# Patient Record
Sex: Male | Born: 1970 | Race: White | Hispanic: No | Marital: Married | Smoking: Never smoker
Health system: Southern US, Community
[De-identification: ages and names within clinical notes are randomized; demographics above are authoritative.]

## PROBLEM LIST (undated history)

## (undated) DIAGNOSIS — K429 Umbilical hernia without obstruction or gangrene: Secondary | ICD-10-CM

## (undated) DIAGNOSIS — G4733 Obstructive sleep apnea (adult) (pediatric): Secondary | ICD-10-CM

## (undated) DIAGNOSIS — Z9989 Dependence on other enabling machines and devices: Secondary | ICD-10-CM

## (undated) DIAGNOSIS — G4730 Sleep apnea, unspecified: Secondary | ICD-10-CM

## (undated) HISTORY — PX: TONSILLECTOMY: SUR1361

---

## 1998-08-22 ENCOUNTER — Ambulatory Visit (HOSPITAL_COMMUNITY): Admission: RE | Admit: 1998-08-22 | Discharge: 1998-08-22 | Payer: Self-pay | Admitting: Internal Medicine

## 1998-08-22 ENCOUNTER — Encounter: Payer: Self-pay | Admitting: Internal Medicine

## 2001-08-29 ENCOUNTER — Ambulatory Visit (HOSPITAL_BASED_OUTPATIENT_CLINIC_OR_DEPARTMENT_OTHER): Admission: RE | Admit: 2001-08-29 | Discharge: 2001-08-29 | Payer: Self-pay | Admitting: Internal Medicine

## 2001-11-21 ENCOUNTER — Ambulatory Visit (HOSPITAL_BASED_OUTPATIENT_CLINIC_OR_DEPARTMENT_OTHER): Admission: RE | Admit: 2001-11-21 | Discharge: 2001-11-21 | Payer: Self-pay | Admitting: Otolaryngology

## 2014-05-13 ENCOUNTER — Emergency Department (HOSPITAL_BASED_OUTPATIENT_CLINIC_OR_DEPARTMENT_OTHER): Payer: No Typology Code available for payment source

## 2014-05-13 ENCOUNTER — Encounter (HOSPITAL_BASED_OUTPATIENT_CLINIC_OR_DEPARTMENT_OTHER): Payer: Self-pay | Admitting: *Deleted

## 2014-05-13 ENCOUNTER — Emergency Department (HOSPITAL_BASED_OUTPATIENT_CLINIC_OR_DEPARTMENT_OTHER)
Admission: EM | Admit: 2014-05-13 | Discharge: 2014-05-13 | Disposition: A | Discharge status: Home / Self Care | Payer: No Typology Code available for payment source | Attending: Emergency Medicine | Admitting: Emergency Medicine

## 2014-05-13 DIAGNOSIS — M533 Sacrococcygeal disorders, not elsewhere classified: Secondary | ICD-10-CM | POA: Diagnosis not present

## 2014-05-13 DIAGNOSIS — G4730 Sleep apnea, unspecified: Secondary | ICD-10-CM | POA: Diagnosis not present

## 2014-05-13 DIAGNOSIS — R109 Unspecified abdominal pain: Secondary | ICD-10-CM | POA: Diagnosis present

## 2014-05-13 DIAGNOSIS — R112 Nausea with vomiting, unspecified: Secondary | ICD-10-CM | POA: Diagnosis not present

## 2014-05-13 DIAGNOSIS — M549 Dorsalgia, unspecified: Secondary | ICD-10-CM

## 2014-05-13 DIAGNOSIS — Z9981 Dependence on supplemental oxygen: Secondary | ICD-10-CM | POA: Insufficient documentation

## 2014-05-13 HISTORY — DX: Sleep apnea, unspecified: G47.30

## 2014-05-13 HISTORY — DX: Dependence on other enabling machines and devices: Z99.89

## 2014-05-13 LAB — URINALYSIS, ROUTINE W REFLEX MICROSCOPIC
Bilirubin Urine: NEGATIVE
Glucose, UA: NEGATIVE mg/dL
Ketones, ur: NEGATIVE mg/dL
Leukocytes, UA: NEGATIVE
Nitrite: NEGATIVE
Protein, ur: NEGATIVE mg/dL
Specific Gravity, Urine: 1.018 (ref 1.005–1.030)
Urobilinogen, UA: 1 mg/dL (ref 0.0–1.0)
pH: 5 (ref 5.0–8.0)

## 2014-05-13 LAB — URINE MICROSCOPIC-ADD ON

## 2014-05-13 MED ORDER — METHOCARBAMOL 500 MG PO TABS: 500.0000 mg | ORAL_TABLET | Freq: Two times a day (BID) | ORAL | Status: DC

## 2014-05-13 MED ORDER — ONDANSETRON HCL 4 MG/2ML IJ SOLN
4.0000 mg | Freq: Once | INTRAMUSCULAR | Status: AC
  Administered 2014-05-13: 4 mg via INTRAVENOUS
  Filled 2014-05-13: qty 2

## 2014-05-13 MED ORDER — KETOROLAC TROMETHAMINE 30 MG/ML IJ SOLN
30.0000 mg | Freq: Once | INTRAMUSCULAR | Status: AC
  Administered 2014-05-13: 30 mg via INTRAVENOUS
  Filled 2014-05-13: qty 1

## 2014-05-13 MED ORDER — GI COCKTAIL ~~LOC~~
30.0000 mL | Freq: Once | ORAL | Status: AC
  Administered 2014-05-13: 30 mL via ORAL
  Filled 2014-05-13: qty 30

## 2014-05-13 MED ORDER — MELOXICAM 7.5 MG PO TABS: 7.5000 mg | ORAL_TABLET | Freq: Every day | ORAL | Status: DC

## 2014-05-13 NOTE — ED Notes (Signed)
Patient transported to CT 

## 2014-05-13 NOTE — ED Notes (Signed)
PT returned back from CT.

## 2014-05-13 NOTE — ED Notes (Signed)
Pt states bilateral flank pain that started one month ago. States pain comes and goes. Has had n/v and fevers. Has seen primary MD but no testing to confirm a kidney stone. Denies any blood in his urine. Denies any urinary symptoms. States around midnight pain started bilateral with n/v. States he feels like he has a fever. Pt presents unable to sit still.

## 2014-05-13 NOTE — Discharge Instructions (Signed)

## 2014-05-13 NOTE — ED Provider Notes (Signed)
CSN: [id number]     Arrival date & time 05/13/14  0224 History   First MD Initiated Contact with Patient 05/13/14 0315     Chief Complaint  Patient presents with  . Flank Pain     (Consider location/radiation/quality/duration/timing/severity/associated sxs/prior Treatment) Patient is a 43 y.o. male presenting with back pain. The history is provided by the patient.  Back Pain Location:  Sacro-iliac joint Quality:  Cramping Radiates to:  Does not radiate Pain severity:  Severe Pain is:  Same all the time Onset quality:  Sudden Duration:  4 weeks Timing:  Intermittent Progression:  Unchanged Chronicity:  New Context: not MCA and not MVA   Relieved by:  Nothing Worsened by:  Nothing tried Ineffective treatments:  None tried Associated symptoms: no abdominal pain, no abdominal swelling, no bladder incontinence, no bowel incontinence, no chest pain, no dysuria, no headaches, no leg pain, no numbness, no paresthesias, no pelvic pain, no perianal numbness, no tingling and no weakness   Risk factors: no hx of cancer   Nausea and vomiting  Past Medical History  Diagnosis Date  . Sleep apnea   . CPAP (continuous positive airway pressure) dependence    Past Surgical History  Procedure Laterality Date  . Tonsillectomy     No family history on file. History  Substance Use Topics  . Smoking status: Never Smoker   . Smokeless tobacco: Not on file  . Alcohol Use: Yes     Comment: occasional     Review of Systems  Cardiovascular: Negative for chest pain.  Gastrointestinal: Negative for abdominal pain and bowel incontinence.  Genitourinary: Negative for bladder incontinence, dysuria and pelvic pain.  Musculoskeletal: Positive for back pain.  Neurological: Negative for tingling, weakness, numbness, headaches and paresthesias.  All other systems reviewed and are negative.     Allergies  Zithromax  Home Medications   Prior to Admission medications   Not on File   BP  144/85 mmHg  Pulse 56  Temp(Src) 97.6 F (36.4 C)  Resp 18  Ht 5\' 11"  (1.803 m)  Wt 230 lb (104.327 kg)  BMI 32.09 kg/m2  SpO2 100% Physical Exam  Constitutional: He is oriented to person, place, and time. He appears well-developed and well-nourished. No distress.  HENT:  Head: Normocephalic and atraumatic.  Mouth/Throat: Oropharynx is clear and moist.  Eyes: Conjunctivae are normal. Pupils are equal, round, and reactive to light.  Neck: Normal range of motion. Neck supple.  Cardiovascular: Normal rate and regular rhythm.   Pulmonary/Chest: Effort normal and breath sounds normal. No stridor. He has no wheezes. He has no rales.  Abdominal: Soft. Bowel sounds are increased. There is no tenderness. There is no rigidity, no rebound, no guarding, no tenderness at McBurney's point and negative Murphy's sign.  Musculoskeletal: Normal range of motion.       Lumbar back: He exhibits normal range of motion, no tenderness, no bony tenderness, no swelling, no edema, no deformity, no laceration, no pain, no spasm and normal pulse.       Arms: Neurological: He is alert and oriented to person, place, and time. He displays normal reflexes. Coordination normal.  Skin: Skin is warm and dry.  Psychiatric: He has a normal mood and affect.    ED Course  Procedures (including critical care time) Labs Review Labs Reviewed  URINALYSIS, ROUTINE W REFLEX MICROSCOPIC - Abnormal; Notable for the following:    Hgb urine dipstick SMALL (*)    All other components within normal limits  URINE MICROSCOPIC-ADD ON    Imaging Review Ct Renal Stone Study  05/13/2014   CLINICAL DATA:  Bilateral flank pain beginning 1 month ago.  EXAM: CT ABDOMEN AND PELVIS WITHOUT CONTRAST  TECHNIQUE: Multidetector CT imaging of the abdomen and pelvis was performed following the standard protocol without IV contrast.  COMPARISON:  None.  FINDINGS: The lung bases are clear.  No pleural or pericardial effusion.  No renal or ureteral  stones are identified. There is no hydronephrosis on the right or left. The urinary bladder is unremarkable. The prostate gland is mildly enlarged.  The liver, gallbladder, spleen, adrenal glands, biliary tree and pancreas appear normal. The stomach, small and large bowel and appendix appear normal. There is no lymphadenopathy or fluid. A very small left periumbilical hernia is identified. No focal bony abnormality is identified.  IMPRESSION: Negative for urinary tract stone.  No acute finding.  Mild enlargement of prostate gland.  Small fat containing periumbilical hernia.   Electronically Signed   By: Drusilla Kannerhomas  Dalessio M.D.   On: 05/13/2014 03:53     EKG Interpretation None      MDM   Final diagnoses:  Back pain    There are no stones in the kidneys or ureters.  Bones and bowel are healthy.  Suspect muscular pain as the source will treat with NSAIDs and muscle relaxers and have advised close follow up    Mycheal Veldhuizen Smitty CordsK Markcus Lazenby-Rasch, MD 05/13/14 [phone]

## 2014-05-18 ENCOUNTER — Encounter (HOSPITAL_BASED_OUTPATIENT_CLINIC_OR_DEPARTMENT_OTHER): Payer: Self-pay | Admitting: Emergency Medicine

## 2014-05-18 ENCOUNTER — Emergency Department (HOSPITAL_BASED_OUTPATIENT_CLINIC_OR_DEPARTMENT_OTHER)
Admission: EM | Admit: 2014-05-18 | Discharge: 2014-05-18 | Disposition: A | Discharge status: Home / Self Care | Payer: No Typology Code available for payment source | Attending: Emergency Medicine | Admitting: Emergency Medicine

## 2014-05-18 DIAGNOSIS — Z79899 Other long term (current) drug therapy: Secondary | ICD-10-CM | POA: Insufficient documentation

## 2014-05-18 DIAGNOSIS — M5440 Lumbago with sciatica, unspecified side: Secondary | ICD-10-CM | POA: Insufficient documentation

## 2014-05-18 DIAGNOSIS — Z791 Long term (current) use of non-steroidal anti-inflammatories (NSAID): Secondary | ICD-10-CM | POA: Diagnosis not present

## 2014-05-18 DIAGNOSIS — M545 Low back pain: Secondary | ICD-10-CM

## 2014-05-18 DIAGNOSIS — G4730 Sleep apnea, unspecified: Secondary | ICD-10-CM | POA: Insufficient documentation

## 2014-05-18 DIAGNOSIS — Z9981 Dependence on supplemental oxygen: Secondary | ICD-10-CM | POA: Diagnosis not present

## 2014-05-18 LAB — URINALYSIS, ROUTINE W REFLEX MICROSCOPIC
Bilirubin Urine: NEGATIVE
Glucose, UA: NEGATIVE mg/dL
Ketones, ur: NEGATIVE mg/dL
Leukocytes, UA: NEGATIVE
Nitrite: NEGATIVE
PH: 6 (ref 5.0–8.0)
PROTEIN: NEGATIVE mg/dL
Specific Gravity, Urine: 1.035 — ABNORMAL HIGH (ref 1.005–1.030)
Urobilinogen, UA: 2 mg/dL — ABNORMAL HIGH (ref 0.0–1.0)

## 2014-05-18 LAB — URINE MICROSCOPIC-ADD ON

## 2014-05-18 MED ORDER — KETOROLAC TROMETHAMINE 60 MG/2ML IM SOLN
60.0000 mg | Freq: Once | INTRAMUSCULAR | Status: AC
  Administered 2014-05-18: 60 mg via INTRAMUSCULAR
  Filled 2014-05-18: qty 2

## 2014-05-18 MED ORDER — NAPROXEN 500 MG PO TABS: 500.0000 mg | ORAL_TABLET | Freq: Two times a day (BID) | ORAL | Status: AC

## 2014-05-18 MED ORDER — OXYCODONE-ACETAMINOPHEN 5-325 MG PO TABS: 1.0000 | ORAL_TABLET | ORAL | Status: DC | PRN

## 2014-05-18 MED ORDER — ORPHENADRINE CITRATE ER 100 MG PO TB12: 100.0000 mg | ORAL_TABLET | Freq: Two times a day (BID) | ORAL | Status: DC

## 2014-05-18 NOTE — ED Notes (Signed)
Pt states that the pain is too deep for heat or ice to provide comfort, pt laughing and walking around during triage, states that it hurts to sit, pt stated that he has been taking the mobic, but trying to wean off the muscle relaxer in am. Stated that pain gets significantly worse throughout  His day, but reports that he has been resting and trying to give it time to heal during the day, therefore not doing much activity

## 2014-05-18 NOTE — ED Notes (Signed)
Pt states that he has had lower back pain x 1 1/2 months, seen here on 11/9, pt got some better, tonight it got significantly worse to bilaterally lower back, pt states pcp can see him on monday

## 2014-05-18 NOTE — ED Notes (Signed)
Med hold till 0325

## 2014-05-18 NOTE — ED Provider Notes (Signed)
CSN: [id number]     Arrival date & time 05/18/14  0207 History   First MD Initiated Contact with Patient 05/18/14 [phone]     Chief Complaint  Patient presents with  . Back Pain     (Consider location/radiation/quality/duration/timing/severity/associated sxs/prior Treatment) Patient is a 43 y.o. male presenting with back pain. The history is provided by the patient.  Back Pain He has been having pain across his lower back for the last month, but it has been worse over the last week. Pain was initially intermittent and is now constant. He rates pain at 7/10. It is worse with movement and better with being still. Pain does not radiate. He describes it as a sharp pain. He denies any weakness, numbness, tingling. There is no bowel or bladder dysfunction. He had been seen in the ED and had been given a shot of ketorolac which did give him some temporary relief, and was discharged with prescriptions for meloxicam and methocarbamol which have not been giving him any relief. If anything, pain seems to be getting worse. He denies any trauma and denies any unusual activity. He does relate recent 30 pound voluntary weight loss which was initiated because he was told he was prediabetic.  Past Medical History  Diagnosis Date  . Sleep apnea   . CPAP (continuous positive airway pressure) dependence    Past Surgical History  Procedure Laterality Date  . Tonsillectomy     History reviewed. No pertinent family history. History  Substance Use Topics  . Smoking status: Never Smoker   . Smokeless tobacco: Not on file  . Alcohol Use: Yes     Comment: occasional     Review of Systems  Musculoskeletal: Positive for back pain.  All other systems reviewed and are negative.     Allergies  Zithromax  Home Medications   Prior to Admission medications   Medication Sig Start Date End Date Taking? Authorizing Provider  meloxicam (MOBIC) 7.5 MG tablet Take 1 tablet (7.5 mg total) by mouth daily. 05/13/14    April K Palumbo-Rasch, MD  methocarbamol (ROBAXIN) 500 MG tablet Take 1 tablet (500 mg total) by mouth 2 (two) times daily. 05/13/14   April K Palumbo-Rasch, MD  naproxen (NAPROSYN) 500 MG tablet Take 1 tablet (500 mg total) by mouth 2 (two) times daily. 05/18/14   Dione Boozeavid Kiarah Eckstein, MD  orphenadrine (NORFLEX) 100 MG tablet Take 1 tablet (100 mg total) by mouth 2 (two) times daily. 05/18/14   Dione Boozeavid Avalie Oconnor, MD  oxyCODONE-acetaminophen (PERCOCET) 5-325 MG per tablet Take 1 tablet by mouth every 4 (four) hours as needed for moderate pain. 05/18/14   Dione Boozeavid Tyria Springer, MD   BP 143/76 mmHg  Pulse 76  Temp(Src) 98.6 F (37 C) (Oral)  Resp 18  SpO2 100% Physical Exam  Nursing note and vitals reviewed.  43 year old male, resting comfortably and in no acute distress. Vital signs are significant for borderline hypertension. Oxygen saturation is 100%, which is normal. Head is normocephalic and atraumatic. PERRLA, EOMI. Oropharynx is clear. Neck is nontender and supple without adenopathy or JVD. Back is moderately tender throughout the soft tissues of the area lumbar area. There is mild bilateral CVA tenderness. Straight leg raise is positive bilaterally at 30. There is mild to moderate paralumbar spasm bilaterally. Lungs are clear without rales, wheezes, or rhonchi. Chest is nontender. Heart has regular rate and rhythm without murmur. Abdomen is soft, flat, nontender without masses or hepatosplenomegaly and peristalsis is normoactive. Extremities have no cyanosis or  edema, full range of motion is present. Skin is warm and dry without rash. Neurologic: Mental status is normal, cranial nerves are intact, there are no motor or sensory deficits.  ED Course  Procedures (including critical care time) Labs Review Results for orders placed or performed during the hospital encounter of 05/13/14  Urinalysis, Routine w reflex microscopic  Result Value Ref Range   Color, Urine YELLOW YELLOW   APPearance CLEAR CLEAR    Specific Gravity, Urine 1.018 1.005 - 1.030   pH 5.0 5.0 - 8.0   Glucose, UA NEGATIVE NEGATIVE mg/dL   Hgb urine dipstick SMALL (A) NEGATIVE   Bilirubin Urine NEGATIVE NEGATIVE   Ketones, ur NEGATIVE NEGATIVE mg/dL   Protein, ur NEGATIVE NEGATIVE mg/dL   Urobilinogen, UA 1.0 0.0 - 1.0 mg/dL   Nitrite NEGATIVE NEGATIVE   Leukocytes, UA NEGATIVE NEGATIVE  Urine microscopic-add on  Result Value Ref Range   Squamous Epithelial / LPF RARE RARE   WBC, UA 0-2 <3 WBC/hpf   RBC / HPF 0-2 <3 RBC/hpf   Bacteria, UA RARE RARE    MDM   Final diagnoses:  Bilateral low back pain, with sciatica presence unspecified    Low back pain which has failed to respond to a course of NSAIDs and muscle relaxers. Old records are reviewed and he had workup with CT scan on his previous ED visit. The scan showed no evidence of renal calculi. I reviewed the scan and the lumbar spine appears normal with mild degenerative changes. Pain does seem to be musculoskeletal. I will try switching his NSAID and muscle laxer. He is discharged with prescriptions for naproxen and orphenadrine and is also given a prescription for oxycodone-acetaminophen. He is to follow-up with his PCP. Recommended consideration for trial of physical therapy.    Dione Boozeavid Vivan Vanderveer, MD 05/18/14 [phone]

## 2014-05-18 NOTE — Discharge Instructions (Signed)
Back Pain, Adult °Low back pain is very common. About 1 in 5 people have back pain. The cause of low back pain is rarely dangerous. The pain often gets better over time. About half of people with a sudden onset of back pain feel better in just 2 weeks. About 8 in 10 people feel better by 6 weeks.  °CAUSES °Some common causes of back pain include: °· Strain of the muscles or ligaments supporting the spine. °· Wear and tear (degeneration) of the spinal discs. °· Arthritis. °· Direct injury to the back. °DIAGNOSIS °Most of the time, the direct cause of low back pain is not known. However, back pain can be treated effectively even when the exact cause of the pain is unknown. Answering your caregiver's questions about your overall health and symptoms is one of the most accurate ways to make sure the cause of your pain is not dangerous. If your caregiver needs more information, he or she may order lab work or imaging tests (X-rays or MRIs). However, even if imaging tests show changes in your back, this usually does not require surgery. °HOME CARE INSTRUCTIONS °For many people, back pain returns. Since low back pain is rarely dangerous, it is often a condition that people can learn to manage on their own.  °· Remain active. It is stressful on the back to sit or stand in one place. Do not sit, drive, or stand in one place for more than 30 minutes at a time. Take short walks on level surfaces as soon as pain allows. Try to increase the length of time you walk each day. °· Do not stay in bed. Resting more than 1 or 2 days can delay your recovery. °· Do not avoid exercise or work. Your body is made to move. It is not dangerous to be active, even though your back may hurt. Your back will likely heal faster if you return to being active before your pain is gone. °· Pay attention to your body when you  bend and lift. Many people have less discomfort when lifting if they bend their knees, keep the load close to their bodies, and  avoid twisting. Often, the most comfortable positions are those that put less stress on your recovering back. °· Find a comfortable position to sleep. Use a firm mattress and lie on your side with your knees slightly bent. If you lie on your back, put a pillow under your knees. °· Only take over-the-counter or prescription medicines as directed by your caregiver. Over-the-counter medicines to reduce pain and inflammation are often the most helpful. Your caregiver may prescribe muscle relaxant drugs. These medicines help dull your pain so you can more quickly return to your normal activities and healthy exercise. °· Put ice on the injured area. °¨ Put ice in a plastic bag. °¨ Place a towel between your skin and the bag. °¨ Leave the ice on for 15-20 minutes, 03-04 times a day for the first 2 to 3 days. After that, ice and heat may be alternated to reduce pain and spasms. °· Ask your caregiver about trying back exercises and gentle massage. This may be of some benefit. °· Avoid feeling anxious or stressed. Stress increases muscle tension and can worsen back pain. It is important to recognize when you are anxious or stressed and learn ways to manage it. Exercise is a great option. °SEEK MEDICAL CARE IF: °· You have pain that is not relieved with rest or medicine. °· You have pain that does not improve in 1 week. °· You have new symptoms. °· You are generally not feeling well. °SEEK   IMMEDIATE MEDICAL CARE IF:  °· You have pain that radiates from your back into your legs. °· You develop new bowel or bladder control problems. °· You have unusual weakness or numbness in your arms or legs. °· You develop nausea or vomiting. °· You develop abdominal pain. °· You feel faint. °Document Released: 06/21/2005 Document Revised: 12/21/2011 Document Reviewed: 10/23/2013 °[employer]® Patient Information ©2015 [employer]. This information is not intended to replace advice given to you by your health care provider. Make sure you  discuss any questions you have with your health care provider. ° °Esomeprazole; naproxen delayed release tablets °What is this medicine? °ESOMEPRAZOLE; NAPROXEN (es oh ME pray zol; na PROX en) is two medicines together. Naproxen is a non-steroidal anti-inflammatory drug (NSAID). It is used to treat the pain of arthritis. Esomeprazole is a proton pump inhibitor (PPI). It is used to prevent stomach problems from the naproxen. °This medicine may be used for other purposes; ask your health care provider or pharmacist if you have questions. °COMMON BRAND NAME(S): Vimovo °What should I tell my health care provider before I take this medicine? °They need to know if you have any of these conditions: °-asthma °-cigarette smoker °-drink more than 3 alcohol containing beverages a day °-heart disease or circulation problems such as heart failure or leg edema (fluid retention) °-high blood pressure °-kidney disease °-liver disease °-low levels of magnesium in the blood °-stomach bleeding or ulcers °-an unusual or allergic reaction to naproxen, aspirin, other NSAIDs, esomeprazole or other proton pump inhibitors, other medicines, foods, dyes, or preservatives °-pregnant or trying to get pregnant °-breast-feeding °How should I use this medicine? °Take this medicine by mouth with a glass of water. Follow the directions on the prescription label. Do not crush, chew, split, or dissolve. Take this medicine on an empty stomach at least 30 minutes before a meal. Take your medicine at regular intervals. Do not take it more often than directed. Long term, continuous use may increase the risk of heart attack or stroke. °A special MedGuide will be given to you by the pharmacist with each prescription and refill. Be sure to read this information carefully each time. °Talk to your pediatrician regarding the use of this medicine in children. Special care may be needed. °Overdosage: If you think you've taken too much of this medicine contact a  poison control center or emergency room at once. °Overdosage: If you think you have taken too much of this medicine contact a poison control center or emergency room at once. °NOTE: This medicine is only for you. Do not share this medicine with others. °What if I miss a dose? °If you miss a dose, take it as soon as you can. If it is almost time for your next dose, take only that dose. Do not take double or extra doses. °What may interact with this medicine? °Do not take this medicine with any of the following medications: °-atazanavir °-nelfinavir °This medicine may also interact with the following medications: °-alcohol °-aspirin and aspirin-like medicines °-cholestyramine °-cidofovir °-delavirdine °-diazepam °-digoxin °-diuretics °-erlotinib °-fosphenytoin or phenytoin for seizures °-iron salts °-lithium °-medicines for blood pressure °-medicines for depression, anxiety, or psychotic disturbances °-medicines for fungal infections like ketoconazole and itraconazole °-medicines for stomach, or intestine problems, like acid reflux or GERD °-medicines that treat or prevent blood clots like warfarin, enoxaparin, and dalteparin °-methotrexate °-other NSAIDs, medicines for pain and inflammation, like ibuprofen °-pemetrexed °-probenecid °-rifampin °-steroid medicines like prednisone or cortisone °-sucralfate °-sulfonamides °-sulfonylureas °-St. John's Wort °-tacrolimus °  This list may not describe all possible interactions. Give your health care provider a list of all the medicines, herbs, non-prescription drugs, or dietary supplements you use. Also tell them if you smoke, drink alcohol, or use illegal drugs. Some items may interact with your medicine. °What should I watch for while using this medicine? °Tell your doctor or health care professional if your pain does not get better. Talk to your doctor before taking another medicine for pain. Do not treat yourself. °You may need blood work done while you are taking this  medicine. °This medicine does not prevent heart attack or stroke. In fact, this medicine may increase the chance of a heart attack or stroke. The chance may increase with longer use of this medicine and in people who have heart disease. If you take aspirin to prevent heart attack or stroke, talk with your doctor or health care professional. °Do not take other medicines that contain aspirin, ibuprofen, or naproxen with this medicine. Side effects such as stomach upset, nausea, or ulcers may be more likely to occur. Many medicines available without a prescription should not be taken with this medicine. °This medicine can cause ulcers and bleeding in the stomach and intestines at any time during treatment. Do not smoke cigarettes or drink alcohol. These increase irritation to your stomach and can make it more susceptible to damage from this medicine. Ulcers and bleeding can happen without warning symptoms and can cause death. °You may get drowsy or dizzy. Do not drive, use machinery, or do anything that needs mental alertness until you know how this medicine affects you. Do not stand or sit up quickly, especially if you are an older patient. This reduces the risk of dizzy or fainting spells. °This medicine can cause you to bleed more easily. Try to avoid damage to your teeth and gums when you brush or floss your teeth. °What side effects may I notice from receiving this medicine? °Side effects that you should report to your doctor or health care professional as soon as possible: °-allergic reactions like skin rash, itching or hives, swelling of the face, lips, or tongue °-black or bloody stools, blood in the urine or vomit °-blurred vision °-bone, muscle or joint pain °-chest pain °-dark yellow or brown urine °-difficulty breathing or wheezing °-dizziness °-fast, irregular heartbeat °-feeling faint or lightheaded °-fever or sore throat °-muscle spasms °-nausea or vomiting °-palpitations °-seizures °-slurred speech or  weakness on one side of the body °-stomach pain °-tremors °-unexplained weight gain or swelling °-unusual bleeding or bruising °-unusually weak or tired °-yellowing of eyes or skin °-vomiting °Side effects that usually do not require medical attention (report to your doctor or health care professional if they continue or are bothersome): °-constipation °-diarrhea °-dry mouth °-headache °-heartburn °-nausea °This list may not describe all possible side effects. Call your doctor for medical advice about side effects. You may report side effects to FDA at 1-800-FDA-1088. °Where should I keep my medicine? °Keep out of the reach of children. °Store at room temperature between 15 and 30 degrees C (59 and 86 degrees F). Protect from light and moisture. Throw away any unused medicine after the expiration date. °NOTE: This sheet is a summary. It may not cover all possible information. If you have questions about this medicine, talk to your doctor, pharmacist, or health care provider. °© 2015, [employer]. (2013-04-11 09:43:16) ° °Orphenadrine tablets °What is this medicine? °ORPHENADRINE (or FEN a dreen) helps to relieve pain and stiffness in muscles and   can treat muscle spasms. °This medicine may be used for other purposes; ask your health care provider or pharmacist if you have questions. °COMMON BRAND NAME(S): Norflex °What should I tell my health care provider before I take this medicine? °They need to know if you have any of these conditions: °-glaucoma °-heart disease °-kidney disease °-myasthenia gravis °-peptic ulcer disease °-prostate disease °-stomach problems °-an unusual or allergic reaction to orphenadrine, other medicines, foods, lactose, dyes, or preservatives °-pregnant or trying to get pregnant °-breast-feeding °How should I use this medicine? °Take this medicine by mouth with a full glass of water. Follow the directions on the prescription label. Take your medicine at regular intervals. Do not  take your medicine more often than directed. Do not take more than you are told to take. °Talk to your pediatrician regarding the use of this medicine in children. Special care may be needed. °Patients over 65 years old may have a stronger reaction and need a smaller dose. °Overdosage: If you think you have taken too much of this medicine contact a poison control center or emergency room at once. °NOTE: This medicine is only for you. Do not share this medicine with others. °What if I miss a dose? °If you miss a dose, take it as soon as you can. If it is almost time for your next dose, take only that dose. Do not take double or extra doses. °What may interact with this medicine? °-alcohol °-antihistamines °-barbiturates, like phenobarbital °-benzodiazepines °-cyclobenzaprine °-medicines for pain °-phenothiazines like chlorpromazine, mesoridazine, prochlorperazine, thioridazine °This list may not describe all possible interactions. Give your health care provider a list of all the medicines, herbs, non-prescription drugs, or dietary supplements you use. Also tell them if you smoke, drink alcohol, or use illegal drugs. Some items may interact with your medicine. °What should I watch for while using this medicine? °Your mouth may get dry. Chewing sugarless gum or sucking hard candy, and drinking plenty of water may help. Contact your doctor if the problem does not go away or is severe. °This medicine may cause dry eyes and blurred vision. If you wear contact lenses you may feel some discomfort. Lubricating drops may help. See your eye doctor if the problem does not go away or is severe. °You may get drowsy or dizzy. Do not drive, use machinery, or do anything that needs mental alertness until you know how this medicine affects you. Do not stand or sit up quickly, especially if you are an older patient. This reduces the risk of dizzy or fainting spells. Alcohol may interfere with the effect of this medicine. Avoid  alcoholic drinks. °What side effects may I notice from receiving this medicine? °Side effects that you should report to your doctor or health care professional as soon as possible: °-allergic reactions like skin rash, itching or hives, swelling of the face, lips, or tongue °-changes in vision °-difficulty breathing °-fast heartbeat or palpitations °-hallucinations °-light headedness, fainting spells °-vomiting °Side effects that usually do not require medical attention (report to your doctor or health care professional if they continue or are bothersome): °-dizziness °-drowsiness °-headache °-nausea °This list may not describe all possible side effects. Call your doctor for medical advice about side effects. You may report side effects to FDA at 1-800-FDA-1088. °Where should I keep my medicine? °Keep out of the reach of children. °Store at room temperature between 15 and 30 degrees C (59 and 86 degrees F). Protect from light. Keep container tightly closed. Throw away any unused   medicine after the expiration date. °NOTE: This sheet is a summary. It may not cover all possible information. If you have questions about this medicine, talk to your doctor, pharmacist, or health care provider. °© 2015, [employer]. (2008-01-16 17:19:12) ° °Acetaminophen; Oxycodone tablets °What is this medicine? °ACETAMINOPHEN; OXYCODONE (a set a MEE noe fen; ox i KOE done) is a pain reliever. It is used to treat mild to moderate pain. °This medicine may be used for other purposes; ask your health care provider or pharmacist if you have questions. °COMMON BRAND NAME(S): Endocet, Magnacet, Narvox, Percocet, Perloxx, Primalev, Primlev, Roxicet, Xolox °What should I tell my health care provider before I take this medicine? °They need to know if you have any of these conditions: °-brain tumor °-Crohn's disease, inflammatory bowel disease, or ulcerative colitis °-drug abuse or addiction °-head injury °-heart or circulation  problems °-if you often drink alcohol °-kidney disease or problems going to the bathroom °-liver disease °-lung disease, asthma, or breathing problems °-an unusual or allergic reaction to acetaminophen, oxycodone, other opioid analgesics, other medicines, foods, dyes, or preservatives °-pregnant or trying to get pregnant °-breast-feeding °How should I use this medicine? °Take this medicine by mouth with a full glass of water. Follow the directions on the prescription label. Take your medicine at regular intervals. Do not take your medicine more often than directed. °Talk to your pediatrician regarding the use of this medicine in children. Special care may be needed. °Patients over 65 years old may have a stronger reaction and need a smaller dose. °Overdosage: If you think you have taken too much of this medicine contact a poison control center or emergency room at once. °NOTE: This medicine is only for you. Do not share this medicine with others. °What if I miss a dose? °If you miss a dose, take it as soon as you can. If it is almost time for your next dose, take only that dose. Do not take double or extra doses. °What may interact with this medicine? °-alcohol °-antihistamines °-barbiturates like amobarbital, butalbital, butabarbital, methohexital, pentobarbital, phenobarbital, thiopental, and secobarbital °-benztropine °-drugs for bladder problems like solifenacin, trospium, oxybutynin, tolterodine, hyoscyamine, and methscopolamine °-drugs for breathing problems like ipratropium and tiotropium °-drugs for certain stomach or intestine problems like propantheline, homatropine methylbromide, glycopyrrolate, atropine, belladonna, and dicyclomine °-general anesthetics like etomidate, ketamine, nitrous oxide, propofol, desflurane, enflurane, halothane, isoflurane, and sevoflurane °-medicines for depression, anxiety, or psychotic disturbances °-medicines for sleep °-muscle relaxants °-naltrexone °-narcotic medicines  (opiates) for pain °-phenothiazines like perphenazine, thioridazine, chlorpromazine, mesoridazine, fluphenazine, prochlorperazine, promazine, and trifluoperazine °-scopolamine °-tramadol °-trihexyphenidyl °This list may not describe all possible interactions. Give your health care provider a list of all the medicines, herbs, non-prescription drugs, or dietary supplements you use. Also tell them if you smoke, drink alcohol, or use illegal drugs. Some items may interact with your medicine. °What should I watch for while using this medicine? °Tell your doctor or health care professional if your pain does not go away, if it gets worse, or if you have new or a different type of pain. You may develop tolerance to the medicine. Tolerance means that you will need a higher dose of the medication for pain relief. Tolerance is normal and is expected if you take this medicine for a long time. °Do not suddenly stop taking your medicine because you may develop a severe reaction. Your body becomes used to the medicine. This does NOT mean you are addicted. Addiction is a behavior related to getting and using   a drug for a non-medical reason. If you have pain, you have a medical reason to take pain medicine. Your doctor will tell you how much medicine to take. If your doctor wants you to stop the medicine, the dose will be slowly lowered over time to avoid any side effects. °You may get drowsy or dizzy. Do not drive, use machinery, or do anything that needs mental alertness until you know how this medicine affects you. Do not stand or sit up quickly, especially if you are an older patient. This reduces the risk of dizzy or fainting spells. Alcohol may interfere with the effect of this medicine. Avoid alcoholic drinks. °There are different types of narcotic medicines (opiates) for pain. If you take more than one type at the same time, you may have more side effects. Give your health care provider a list of all medicines you use. Your  doctor will tell you how much medicine to take. Do not take more medicine than directed. Call emergency for help if you have problems breathing. °The medicine will cause constipation. Try to have a bowel movement at least every 2 to 3 days. If you do not have a bowel movement for 3 days, call your doctor or health care professional. °Do not take Tylenol (acetaminophen) or medicines that have acetaminophen with this medicine. Too much acetaminophen can be very dangerous. Many nonprescription medicines contain acetaminophen. Always read the labels carefully to avoid taking more acetaminophen. °What side effects may I notice from receiving this medicine? °Side effects that you should report to your doctor or health care professional as soon as possible: °-allergic reactions like skin rash, itching or hives, swelling of the face, lips, or tongue °-breathing difficulties, wheezing °-confusion °-light headedness or fainting spells °-severe stomach pain °-unusually weak or tired °-yellowing of the skin or the whites of the eyes °Side effects that usually do not require medical attention (report to your doctor or health care professional if they continue or are bothersome): °-dizziness °-drowsiness °-nausea °-vomiting °This list may not describe all possible side effects. Call your doctor for medical advice about side effects. You may report side effects to FDA at 1-800-FDA-1088. °Where should I keep my medicine? °Keep out of the reach of children. This medicine can be abused. Keep your medicine in a safe place to protect it from theft. Do not share this medicine with anyone. Selling or giving away this medicine is dangerous and against the law. °Store at room temperature between 20 and 25 degrees C (68 and 77 degrees F). Keep container tightly closed. Protect from light. °This medicine may cause accidental overdose and death if it is taken by other adults, children, or pets. Flush any unused medicine down the toilet to  reduce the chance of harm. Do not use the medicine after the expiration date. °NOTE: This sheet is a summary. It may not cover all possible information. If you have questions about this medicine, talk to your doctor, pharmacist, or health care provider. °© 2015, [employer]. (2013-02-12 13:17:35) ° °

## 2014-07-14 ENCOUNTER — Observation Stay (HOSPITAL_BASED_OUTPATIENT_CLINIC_OR_DEPARTMENT_OTHER)
Admission: EM | Admit: 2014-07-14 | Discharge: 2014-07-16 | Disposition: A | Discharge status: Home / Self Care | Payer: 59 | Attending: General Surgery | Admitting: General Surgery

## 2014-07-14 ENCOUNTER — Encounter (HOSPITAL_BASED_OUTPATIENT_CLINIC_OR_DEPARTMENT_OTHER): Payer: Self-pay | Admitting: *Deleted

## 2014-07-14 ENCOUNTER — Emergency Department (HOSPITAL_BASED_OUTPATIENT_CLINIC_OR_DEPARTMENT_OTHER): Payer: 59

## 2014-07-14 DIAGNOSIS — Z6830 Body mass index (BMI) 30.0-30.9, adult: Secondary | ICD-10-CM | POA: Insufficient documentation

## 2014-07-14 DIAGNOSIS — K420 Umbilical hernia with obstruction, without gangrene: Secondary | ICD-10-CM | POA: Diagnosis not present

## 2014-07-14 DIAGNOSIS — K810 Acute cholecystitis: Secondary | ICD-10-CM | POA: Diagnosis present

## 2014-07-14 DIAGNOSIS — Z881 Allergy status to other antibiotic agents status: Secondary | ICD-10-CM | POA: Diagnosis not present

## 2014-07-14 DIAGNOSIS — G4733 Obstructive sleep apnea (adult) (pediatric): Secondary | ICD-10-CM | POA: Diagnosis present

## 2014-07-14 DIAGNOSIS — K429 Umbilical hernia without obstruction or gangrene: Secondary | ICD-10-CM | POA: Diagnosis present

## 2014-07-14 DIAGNOSIS — E669 Obesity, unspecified: Secondary | ICD-10-CM | POA: Diagnosis not present

## 2014-07-14 DIAGNOSIS — R109 Unspecified abdominal pain: Secondary | ICD-10-CM

## 2014-07-14 DIAGNOSIS — K8012 Calculus of gallbladder with acute and chronic cholecystitis without obstruction: Principal | ICD-10-CM | POA: Insufficient documentation

## 2014-07-14 DIAGNOSIS — K8000 Calculus of gallbladder with acute cholecystitis without obstruction: Secondary | ICD-10-CM | POA: Diagnosis present

## 2014-07-14 DIAGNOSIS — K819 Cholecystitis, unspecified: Secondary | ICD-10-CM

## 2014-07-14 HISTORY — DX: Umbilical hernia without obstruction or gangrene: K42.9

## 2014-07-14 HISTORY — DX: Obstructive sleep apnea (adult) (pediatric): G47.33

## 2014-07-14 LAB — CBC WITH DIFFERENTIAL/PLATELET
BASOS ABS: 0 10*3/uL (ref 0.0–0.1)
Basophils Relative: 0 % (ref 0–1)
Eosinophils Absolute: 0.1 10*3/uL (ref 0.0–0.7)
Eosinophils Relative: 0 % (ref 0–5)
HEMATOCRIT: 43.4 % (ref 39.0–52.0)
HEMOGLOBIN: 14.9 g/dL (ref 13.0–17.0)
LYMPHS PCT: 14 % (ref 12–46)
Lymphs Abs: 2 10*3/uL (ref 0.7–4.0)
MCH: 28.9 pg (ref 26.0–34.0)
MCHC: 34.3 g/dL (ref 30.0–36.0)
MCV: 84.1 fL (ref 78.0–100.0)
MONOS PCT: 7 % (ref 3–12)
Monocytes Absolute: 1 10*3/uL (ref 0.1–1.0)
NEUTROS ABS: 11.5 10*3/uL — AB (ref 1.7–7.7)
Neutrophils Relative %: 79 % — ABNORMAL HIGH (ref 43–77)
Platelets: 203 10*3/uL (ref 150–400)
RBC: 5.16 MIL/uL (ref 4.22–5.81)
RDW: 13.2 % (ref 11.5–15.5)
WBC: 14.6 10*3/uL — AB (ref 4.0–10.5)

## 2014-07-14 LAB — COMPREHENSIVE METABOLIC PANEL
ALBUMIN: 4.8 g/dL (ref 3.5–5.2)
ALT: 30 U/L (ref 0–53)
AST: 21 U/L (ref 0–37)
Alkaline Phosphatase: 95 U/L (ref 39–117)
Anion gap: 8 (ref 5–15)
BILIRUBIN TOTAL: 0.8 mg/dL (ref 0.3–1.2)
BUN: 27 mg/dL — AB (ref 6–23)
CO2: 24 mmol/L (ref 19–32)
CREATININE: 0.91 mg/dL (ref 0.50–1.35)
Calcium: 9.3 mg/dL (ref 8.4–10.5)
Chloride: 103 mEq/L (ref 96–112)
GFR calc Af Amer: >90 mL/min (ref >90)
GFR calc non Af Amer: >90 mL/min (ref >90)
Glucose, Bld: 114 mg/dL — ABNORMAL HIGH (ref 70–99)
POTASSIUM: 3.9 mmol/L (ref 3.5–5.1)
Sodium: 135 mmol/L (ref 135–145)
Total Protein: 8.1 g/dL (ref 6.0–8.3)

## 2014-07-14 LAB — LIPASE, BLOOD: Lipase: 34 U/L (ref 11–59)

## 2014-07-14 MED ORDER — SODIUM CHLORIDE 0.9 % IV SOLN
Freq: Once | INTRAVENOUS | Status: AC
  Administered 2014-07-15: 01:00:00 via INTRAVENOUS

## 2014-07-14 MED ORDER — SODIUM CHLORIDE 0.9 % IV SOLN
1.5000 g | Freq: Once | INTRAVENOUS | Status: AC
  Administered 2014-07-15: 1.5 g via INTRAVENOUS
  Filled 2014-07-14: qty 1.5

## 2014-07-14 NOTE — ED Provider Notes (Signed)
CSN: [id number]     Arrival date & time 07/14/14  1924 History  This chart was scribed for Vida RollerBrian D Baneen Wieseler, MD by Evon Slackerrance Branch, ED Scribe. This patient was seen in room MH08/MH08 and the patient's care was started at 9:58 PM.     Chief Complaint  Patient presents with  . Back Pain   Patient is a 44 y.o. male presenting with back pain. The history is provided by the patient. No language interpreter was used.  Back Pain  HPI Comments: John Rich is a 44 y.o. male who presents to the Emergency Department complaining of recurrent sharp constant RUQ pain and discomfort onset today 2 PM after eating lunch. Pt states that he has associated nausea and vomiting. Pt states that the pain feels similar to previous pain which has occurred over the last few days. Pt states that he has been having slight pain in the lower back over the laset 2 months - had hematuria and CT of the abd pelvis last week without acute findigns.  Pt states that the pain is worse after eating. Pt states that he has changed his diet with no relief. Denies hematuria. Pt states that he has had recently had a CT scan. Denies abdominal surgeries.  The pain is ongoing for over 6 hours on arrival.  No hx of abdominal surgery.   Past Medical History  Diagnosis Date  . Sleep apnea   . CPAP (continuous positive airway pressure) dependence    Past Surgical History  Procedure Laterality Date  . Tonsillectomy     History reviewed. No pertinent family history. History  Substance Use Topics  . Smoking status: Never Smoker   . Smokeless tobacco: Not on file  . Alcohol Use: Yes     Comment: occasional       Review of Systems  Musculoskeletal: Positive for back pain.  All other systems reviewed and are negative.    Allergies  Zithromax  Home Medications   Prior to Admission medications   Medication Sig Start Date End Date Taking? Authorizing Provider  meloxicam (MOBIC) 7.5 MG tablet Take 1 tablet (7.5 mg total) by  mouth daily. 05/13/14   April K Palumbo-Rasch, MD  methocarbamol (ROBAXIN) 500 MG tablet Take 1 tablet (500 mg total) by mouth 2 (two) times daily. 05/13/14   April K Palumbo-Rasch, MD  naproxen (NAPROSYN) 500 MG tablet Take 1 tablet (500 mg total) by mouth 2 (two) times daily. 05/18/14   Dione Boozeavid Glick, MD  orphenadrine (NORFLEX) 100 MG tablet Take 1 tablet (100 mg total) by mouth 2 (two) times daily. 05/18/14   Dione Boozeavid Glick, MD  oxyCODONE-acetaminophen (PERCOCET) 5-325 MG per tablet Take 1 tablet by mouth every 4 (four) hours as needed for moderate pain. 05/18/14   Dione Boozeavid Glick, MD   Triage Vitals: BP 149/74 mmHg  Pulse 70  Temp(Src) 98.3 F (36.8 C) (Oral)  Resp 18  Ht 5\' 11"  (1.803 m)  Wt 220 lb (99.791 kg)  BMI 30.70 kg/m2  SpO2 98%  Physical Exam  Constitutional: He appears well-developed and well-nourished. No distress.  HENT:  Head: Normocephalic and atraumatic.  Mouth/Throat: Oropharynx is clear and moist. No oropharyngeal exudate.  Eyes: Conjunctivae and EOM are normal. Pupils are equal, round, and reactive to light. Right eye exhibits no discharge. Left eye exhibits no discharge. No scleral icterus.  Neck: Normal range of motion. Neck supple. No JVD present. No thyromegaly present.  Cardiovascular: Normal rate, regular rhythm, normal heart sounds and intact distal  pulses.  Exam reveals no gallop and no friction rub.   No murmur heard. Pulmonary/Chest: Effort normal and breath sounds normal. No respiratory distress. He has no wheezes. He has no rales.  Abdominal: Soft. Bowel sounds are normal. He exhibits no distension and no mass. There is tenderness. There is no guarding.  mild to moderate RUQ tendernedss  Musculoskeletal: Normal range of motion. He exhibits no edema or tenderness.  Lymphadenopathy:    He has no cervical adenopathy.  Neurological: He is alert. Coordination normal.  Skin: Skin is warm and dry. No rash noted. No erythema.  Psychiatric: He has a normal mood and  affect. His behavior is normal.  Nursing note and vitals reviewed.   ED Course  Procedures (including critical care time) DIAGNOSTIC STUDIES: Oxygen Saturation is 98% on RA, normal by my interpretation.    COORDINATION OF CARE: 10:07 PM-Discussed treatment plan with pt at bedside and pt agreed to plan.     Labs Review Labs Reviewed  COMPREHENSIVE METABOLIC PANEL - Abnormal; Notable for the following:    Glucose, Bld 114 (*)    BUN 27 (*)    All other components within normal limits  CBC WITH DIFFERENTIAL - Abnormal; Notable for the following:    WBC 14.6 (*)    Neutrophils Relative % 79 (*)    Neutro Abs 11.5 (*)    All other components within normal limits  LIPASE, BLOOD    Imaging Review US Abdomen Complete  07/14/2014   CLINICAL DATA:  Acute onset of upper quadrant abdominal pain and right flank pain. Back pain. Nausea, vomiting and chills. Initial encounter.  EXAM: ULTRASOUND ABDOMEN COMPLETE  COMPARISON:  CT of the abdomen and pelvis from 06/26/2014  FINDINGS: Gallbladder: Vague echogenic material within the gallbladder moves in a granular fashion, thought to reflect numerous tiny stones. A few larger stones are seen, measuring up to 1.0 cm in size. There is mild gallbladder wall thickening, measuring up to 4 mm, without definite pericholecystic fluid. No ultrasonographic Murphy's sign elicited, though the patient does have relatively significant focal tenderness.  Common bile duct: Diameter: 0.2 cm, within normal limits in caliber.  Liver: No focal lesion identified. Within normal limits in parenchymal echogenicity.  IVC: No abnormality visualized.  Pancreas: Visualized portion unremarkable.  Spleen: Size and appearance within normal limits.  Right Kidney: Length: 12.6 cm. Echogenicity within normal limits. No mass or hydronephrosis visualized.  Left Kidney: Length: 13.3 cm. Echogenicity within normal limits. A small 1.4 cm cyst is noted at the interpole region of the left kidney,  with minimal associated peripheral calcification. No evidence of hydronephrosis.  Abdominal aorta: No aneurysm visualized. The mid abdominal aorta is partially obscured by overlying bowel gas.  Other findings: None.  IMPRESSION: 1. Mild gallbladder wall thickening, with gravel and stones in the gallbladder. Significant focal tenderness at the gallbladder, despite the lack of an ultrasonographic Murphy's sign. This could reflect mild acute cholecystitis. No evidence for distal obstruction. 2. Small left renal cyst, with minimal peripheral calcification.   Electronically Signed   By: Roanna Raider M.D.   On: 07/14/2014 23:01      MDM   Final diagnoses:  Abdominal pain  Acute cholecystitis   Discussed with general surgeon, Dr. Carolynne Edouard, patient has likely acute cholecystitis given abdominal exam findings, ultrasound findings and leukocytosis. He has ongoing pain, on repeat exam after ultrasound he has ongoing tenderness in the right upper quadrant, he is not peritoneal, he is not vomiting, his vital signs  are reassuring. He has been given medications as below including antibiotics and his nothing by mouth status has been verified. He will be transferred to [facility] ER for further surgical care. The patient is in agreement with the plan.  Meds given in ED:  Medications  ampicillin-sulbactam (UNASYN) 1.5 g in sodium chloride 0.9 % 50 mL IVPB (not administered)  0.9 %  sodium chloride infusion (not administered)    New Prescriptions   No medications on file   '   I personally performed the services described in this documentation, which was scribed in my presence. The recorded information has been reviewed and is accurate.      Vida Roller, MD 07/15/14 [phone]

## 2014-07-14 NOTE — ED Notes (Signed)
Pt reports chronic back and (R) sided rib/flank pain since June.  Reports flare up tonight.

## 2014-07-15 ENCOUNTER — Observation Stay (HOSPITAL_COMMUNITY): Payer: 59 | Admitting: Registered Nurse

## 2014-07-15 ENCOUNTER — Encounter (HOSPITAL_COMMUNITY): Payer: Self-pay | Admitting: *Deleted

## 2014-07-15 ENCOUNTER — Encounter (HOSPITAL_COMMUNITY)
Admission: EM | Disposition: A | Discharge status: Home / Self Care | Payer: Self-pay | Source: Home / Self Care | Attending: Emergency Medicine

## 2014-07-15 DIAGNOSIS — K8000 Calculus of gallbladder with acute cholecystitis without obstruction: Secondary | ICD-10-CM | POA: Diagnosis present

## 2014-07-15 HISTORY — PX: LAPAROSCOPIC CHOLECYSTECTOMY SINGLE PORT: SHX5891

## 2014-07-15 LAB — SURGICAL PCR SCREEN
MRSA, PCR: NEGATIVE
Staphylococcus aureus: POSITIVE — AB

## 2014-07-15 SURGERY — LAPAROSCOPIC CHOLECYSTECTOMY SINGLE SITE
Anesthesia: General | Site: Abdomen

## 2014-07-15 MED ORDER — ONDANSETRON HCL 4 MG/2ML IJ SOLN: 4.0000 mg | Freq: Four times a day (QID) | INTRAMUSCULAR | Status: DC | PRN

## 2014-07-15 MED ORDER — CHLORHEXIDINE GLUCONATE 4 % EX LIQD
1.0000 "application " | Freq: Once | CUTANEOUS | Status: DC
  Filled 2014-07-15: qty 15

## 2014-07-15 MED ORDER — SACCHAROMYCES BOULARDII 250 MG PO CAPS
250.0000 mg | ORAL_CAPSULE | Freq: Two times a day (BID) | ORAL | Status: DC
  Administered 2014-07-16: 250 mg via ORAL
  Filled 2014-07-15 (×4): qty 1

## 2014-07-15 MED ORDER — PROPOFOL 10 MG/ML IV BOLUS
INTRAVENOUS | Status: DC | PRN
  Administered 2014-07-15: 200 mg via INTRAVENOUS

## 2014-07-15 MED ORDER — 0.9 % SODIUM CHLORIDE (POUR BTL) OPTIME
TOPICAL | Status: DC | PRN
  Administered 2014-07-15: 1000 mL

## 2014-07-15 MED ORDER — MENTHOL 3 MG MT LOZG
1.0000 | LOZENGE | OROMUCOSAL | Status: DC | PRN
  Filled 2014-07-15: qty 9

## 2014-07-15 MED ORDER — IOHEXOL 300 MG/ML  SOLN
INTRAMUSCULAR | Status: DC | PRN
  Administered 2014-07-15: 1 mL

## 2014-07-15 MED ORDER — METOPROLOL TARTRATE 1 MG/ML IV SOLN
5.0000 mg | Freq: Four times a day (QID) | INTRAVENOUS | Status: DC | PRN
  Filled 2014-07-15: qty 5

## 2014-07-15 MED ORDER — MEPERIDINE HCL 50 MG/ML IJ SOLN: 6.2500 mg | INTRAMUSCULAR | Status: DC | PRN

## 2014-07-15 MED ORDER — MORPHINE SULFATE 2 MG/ML IJ SOLN
2.0000 mg | INTRAMUSCULAR | Status: DC | PRN
  Administered 2014-07-16 (×3): 2 mg via INTRAVENOUS
  Filled 2014-07-15 (×3): qty 1

## 2014-07-15 MED ORDER — PHENOL 1.4 % MT LIQD: 2.0000 | OROMUCOSAL | Status: DC | PRN

## 2014-07-15 MED ORDER — DEXAMETHASONE SODIUM PHOSPHATE 10 MG/ML IJ SOLN
INTRAMUSCULAR | Status: DC | PRN
  Administered 2014-07-15: 10 mg via INTRAVENOUS

## 2014-07-15 MED ORDER — NAPROXEN 500 MG PO TABS
500.0000 mg | ORAL_TABLET | Freq: Two times a day (BID) | ORAL | Status: DC | PRN
  Filled 2014-07-15: qty 1

## 2014-07-15 MED ORDER — MAGIC MOUTHWASH
15.0000 mL | Freq: Four times a day (QID) | ORAL | Status: DC | PRN
  Filled 2014-07-15: qty 15

## 2014-07-15 MED ORDER — CISATRACURIUM BESYLATE 20 MG/10ML IV SOLN
INTRAVENOUS | Status: AC
  Filled 2014-07-15: qty 10

## 2014-07-15 MED ORDER — FENTANYL CITRATE 0.05 MG/ML IJ SOLN
INTRAMUSCULAR | Status: DC | PRN
  Administered 2014-07-15 (×3): 50 ug via INTRAVENOUS
  Administered 2014-07-15: 100 ug via INTRAVENOUS
  Administered 2014-07-15 (×4): 50 ug via INTRAVENOUS

## 2014-07-15 MED ORDER — ATROPINE SULFATE 0.4 MG/ML IJ SOLN
INTRAMUSCULAR | Status: AC
  Filled 2014-07-15: qty 1

## 2014-07-15 MED ORDER — BISACODYL 10 MG RE SUPP: 10.0000 mg | Freq: Two times a day (BID) | RECTAL | Status: DC | PRN

## 2014-07-15 MED ORDER — ACETAMINOPHEN 10 MG/ML IV SOLN
1000.0000 mg | Freq: Once | INTRAVENOUS | Status: AC
  Administered 2014-07-15: 1000 mg via INTRAVENOUS
  Filled 2014-07-15: qty 100

## 2014-07-15 MED ORDER — METOCLOPRAMIDE HCL 5 MG/ML IJ SOLN
INTRAMUSCULAR | Status: AC
  Filled 2014-07-15: qty 2

## 2014-07-15 MED ORDER — ACETAMINOPHEN 325 MG PO TABS: 325.0000 mg | ORAL_TABLET | Freq: Four times a day (QID) | ORAL | Status: DC | PRN

## 2014-07-15 MED ORDER — FENTANYL CITRATE 0.05 MG/ML IJ SOLN
INTRAMUSCULAR | Status: AC
  Filled 2014-07-15: qty 5

## 2014-07-15 MED ORDER — LACTATED RINGERS IR SOLN
Status: DC | PRN
  Administered 2014-07-15: 1000 mL

## 2014-07-15 MED ORDER — ONDANSETRON HCL 4 MG/2ML IJ SOLN
INTRAMUSCULAR | Status: AC
  Filled 2014-07-15: qty 2

## 2014-07-15 MED ORDER — OXYCODONE HCL 5 MG PO TABS
5.0000 mg | ORAL_TABLET | ORAL | Status: DC | PRN
  Administered 2014-07-16: 10 mg via ORAL
  Filled 2014-07-15: qty 2

## 2014-07-15 MED ORDER — KCL IN DEXTROSE-NACL 20-5-0.9 MEQ/L-%-% IV SOLN
INTRAVENOUS | Status: DC
Start: 2014-07-15 — End: 2014-07-15
  Administered 2014-07-15: 07:00:00 via INTRAVENOUS
  Filled 2014-07-15 (×3): qty 1000

## 2014-07-15 MED ORDER — LACTATED RINGERS IV BOLUS (SEPSIS)
1000.0000 mL | Freq: Three times a day (TID) | INTRAVENOUS | Status: DC | PRN
  Administered 2014-07-15 (×2): 1000 mL via INTRAVENOUS
  Filled 2014-07-15 (×2): qty 1000

## 2014-07-15 MED ORDER — METRONIDAZOLE IN NACL 5-0.79 MG/ML-% IV SOLN
INTRAVENOUS | Status: AC
  Filled 2014-07-15: qty 100

## 2014-07-15 MED ORDER — CISATRACURIUM BESYLATE (PF) 10 MG/5ML IV SOLN
INTRAVENOUS | Status: DC | PRN
  Administered 2014-07-15: 8 mg via INTRAVENOUS
  Administered 2014-07-15 (×2): 2 mg via INTRAVENOUS

## 2014-07-15 MED ORDER — LACTATED RINGERS IV SOLN
INTRAVENOUS | Status: DC | PRN
  Administered 2014-07-15 (×2): via INTRAVENOUS

## 2014-07-15 MED ORDER — LIP MEDEX EX OINT
1.0000 "application " | TOPICAL_OINTMENT | Freq: Two times a day (BID) | CUTANEOUS | Status: DC
  Administered 2014-07-15 – 2014-07-16 (×3): 1 via TOPICAL
  Filled 2014-07-15: qty 7

## 2014-07-15 MED ORDER — ACETAMINOPHEN 650 MG RE SUPP: 650.0000 mg | Freq: Four times a day (QID) | RECTAL | Status: DC | PRN

## 2014-07-15 MED ORDER — KETOROLAC TROMETHAMINE 30 MG/ML IJ SOLN
INTRAMUSCULAR | Status: DC | PRN
  Administered 2014-07-15: 30 mg via INTRAVENOUS

## 2014-07-15 MED ORDER — PROPOFOL 10 MG/ML IV BOLUS
INTRAVENOUS | Status: AC
  Filled 2014-07-15: qty 20

## 2014-07-15 MED ORDER — LACTATED RINGERS IV BOLUS (SEPSIS): 1000.0000 mL | Freq: Three times a day (TID) | INTRAVENOUS | Status: DC | PRN

## 2014-07-15 MED ORDER — SUCCINYLCHOLINE CHLORIDE 20 MG/ML IJ SOLN
INTRAMUSCULAR | Status: DC | PRN
  Administered 2014-07-15: 140 mg via INTRAVENOUS

## 2014-07-15 MED ORDER — EPHEDRINE SULFATE 50 MG/ML IJ SOLN
INTRAMUSCULAR | Status: AC
  Filled 2014-07-15: qty 1

## 2014-07-15 MED ORDER — ONDANSETRON HCL 4 MG/2ML IJ SOLN
INTRAMUSCULAR | Status: DC | PRN
Start: 2014-07-15 — End: 2014-07-15
  Administered 2014-07-15: 4 mg via INTRAVENOUS

## 2014-07-15 MED ORDER — GLYCOPYRROLATE 0.2 MG/ML IJ SOLN
INTRAMUSCULAR | Status: AC
  Filled 2014-07-15: qty 3

## 2014-07-15 MED ORDER — METOPROLOL TARTRATE 12.5 MG HALF TABLET
12.5000 mg | ORAL_TABLET | Freq: Two times a day (BID) | ORAL | Status: DC | PRN
  Filled 2014-07-15: qty 1

## 2014-07-15 MED ORDER — PROMETHAZINE HCL 25 MG/ML IJ SOLN: 6.2500 mg | INTRAMUSCULAR | Status: DC | PRN

## 2014-07-15 MED ORDER — DIPHENHYDRAMINE HCL 50 MG/ML IJ SOLN: 12.5000 mg | Freq: Four times a day (QID) | INTRAMUSCULAR | Status: DC | PRN

## 2014-07-15 MED ORDER — KCL IN DEXTROSE-NACL 20-5-0.9 MEQ/L-%-% IV SOLN
INTRAVENOUS | Status: DC
  Administered 2014-07-16: 50 mL/h via INTRAVENOUS
  Filled 2014-07-15: qty 1000

## 2014-07-15 MED ORDER — HYDROMORPHONE HCL 1 MG/ML IJ SOLN
INTRAMUSCULAR | Status: AC
  Filled 2014-07-15: qty 1

## 2014-07-15 MED ORDER — CEFTRIAXONE SODIUM IN DEXTROSE 40 MG/ML IV SOLN
2.0000 g | INTRAVENOUS | Status: DC
  Administered 2014-07-15 – 2014-07-16 (×2): 2 g via INTRAVENOUS
  Filled 2014-07-15 (×2): qty 50

## 2014-07-15 MED ORDER — NEOSTIGMINE METHYLSULFATE 10 MG/10ML IV SOLN
INTRAVENOUS | Status: AC
  Filled 2014-07-15: qty 1

## 2014-07-15 MED ORDER — SODIUM CHLORIDE 0.9 % IJ SOLN
INTRAMUSCULAR | Status: AC
  Filled 2014-07-15: qty 10

## 2014-07-15 MED ORDER — PANTOPRAZOLE SODIUM 40 MG IV SOLR
40.0000 mg | Freq: Every day | INTRAVENOUS | Status: DC
  Filled 2014-07-15: qty 40

## 2014-07-15 MED ORDER — BUPIVACAINE-EPINEPHRINE (PF) 0.25% -1:200000 IJ SOLN
INTRAMUSCULAR | Status: AC
  Filled 2014-07-15: qty 30

## 2014-07-15 MED ORDER — METHOCARBAMOL 500 MG PO TABS: 1000.0000 mg | ORAL_TABLET | Freq: Four times a day (QID) | ORAL | Status: DC | PRN

## 2014-07-15 MED ORDER — METRONIDAZOLE IN NACL 5-0.79 MG/ML-% IV SOLN
INTRAVENOUS | Status: DC | PRN
  Administered 2014-07-15: 500 mg via INTRAVENOUS

## 2014-07-15 MED ORDER — BUPIVACAINE-EPINEPHRINE 0.25% -1:200000 IJ SOLN
INTRAMUSCULAR | Status: DC | PRN
  Administered 2014-07-15: 50 mL

## 2014-07-15 MED ORDER — ZOLPIDEM TARTRATE 5 MG PO TABS: 5.0000 mg | ORAL_TABLET | Freq: Every evening | ORAL | Status: DC | PRN

## 2014-07-15 MED ORDER — LIDOCAINE HCL (CARDIAC) 20 MG/ML IV SOLN
INTRAVENOUS | Status: DC | PRN
  Administered 2014-07-15: 75 mg via INTRAVENOUS

## 2014-07-15 MED ORDER — HYDROMORPHONE HCL 1 MG/ML IJ SOLN
0.2500 mg | INTRAMUSCULAR | Status: DC | PRN
Start: 2014-07-15 — End: 2014-07-16
  Administered 2014-07-15 (×2): 0.5 mg via INTRAVENOUS

## 2014-07-15 MED ORDER — METOCLOPRAMIDE HCL 5 MG/ML IJ SOLN
INTRAMUSCULAR | Status: DC | PRN
  Administered 2014-07-15: 5 mg via INTRAVENOUS

## 2014-07-15 MED ORDER — GLYCOPYRROLATE 0.2 MG/ML IJ SOLN
INTRAMUSCULAR | Status: DC | PRN
  Administered 2014-07-15: .6 mg via INTRAVENOUS

## 2014-07-15 MED ORDER — POLYETHYLENE GLYCOL 3350 17 G PO PACK
17.0000 g | PACK | Freq: Two times a day (BID) | ORAL | Status: DC | PRN
  Filled 2014-07-15: qty 1

## 2014-07-15 MED ORDER — ALUM & MAG HYDROXIDE-SIMETH 200-200-20 MG/5ML PO SUSP: 30.0000 mL | Freq: Four times a day (QID) | ORAL | Status: DC | PRN

## 2014-07-15 MED ORDER — BUPIVACAINE-EPINEPHRINE 0.25% -1:200000 IJ SOLN
INTRAMUSCULAR | Status: AC
  Filled 2014-07-15: qty 1

## 2014-07-15 MED ORDER — DEXAMETHASONE SODIUM PHOSPHATE 10 MG/ML IJ SOLN
INTRAMUSCULAR | Status: AC
  Filled 2014-07-15: qty 1

## 2014-07-15 MED ORDER — ACETAMINOPHEN 10 MG/ML IV SOLN: 1000.0000 mg | Freq: Once | INTRAVENOUS | Status: DC

## 2014-07-15 MED ORDER — MIDAZOLAM HCL 2 MG/2ML IJ SOLN
INTRAMUSCULAR | Status: AC
  Filled 2014-07-15: qty 2

## 2014-07-15 MED ORDER — METHOCARBAMOL 1000 MG/10ML IJ SOLN: 1000.0000 mg | Freq: Four times a day (QID) | INTRAVENOUS | Status: DC | PRN

## 2014-07-15 MED ORDER — NEOSTIGMINE METHYLSULFATE 10 MG/10ML IV SOLN
INTRAVENOUS | Status: DC | PRN
  Administered 2014-07-15: 2 mg via INTRAVENOUS

## 2014-07-15 MED ORDER — MIDAZOLAM HCL 5 MG/5ML IJ SOLN
INTRAMUSCULAR | Status: DC | PRN
  Administered 2014-07-15 (×2): 1 mg via INTRAVENOUS

## 2014-07-15 MED ORDER — ORPHENADRINE CITRATE ER 100 MG PO TB12
100.0000 mg | ORAL_TABLET | Freq: Two times a day (BID) | ORAL | Status: DC
  Administered 2014-07-16: 100 mg via ORAL
  Filled 2014-07-15 (×4): qty 1

## 2014-07-15 MED ORDER — LIDOCAINE HCL (CARDIAC) 20 MG/ML IV SOLN
INTRAVENOUS | Status: AC
  Filled 2014-07-15: qty 5

## 2014-07-15 SURGICAL SUPPLY — 39 items
APPLIER CLIP 5 13 M/L LIGAMAX5 (MISCELLANEOUS) ×3
APR CLP MED LRG 5 ANG JAW (MISCELLANEOUS) ×1
BAG SPEC RTRVL LRG 6X4 10 (ENDOMECHANICALS)
CABLE HIGH FREQUENCY MONO STRZ (ELECTRODE) ×3 IMPLANT
CHLORAPREP W/TINT 26ML (MISCELLANEOUS) ×3 IMPLANT
CLIP APPLIE 5 13 M/L LIGAMAX5 (MISCELLANEOUS) ×1 IMPLANT
COVER MAYO STAND STRL (DRAPES) ×3 IMPLANT
DECANTER SPIKE VIAL GLASS SM (MISCELLANEOUS) ×3 IMPLANT
DRAIN CHANNEL 19F RND (DRAIN) IMPLANT
DRAPE C-ARM 42X120 X-RAY (DRAPES) ×3 IMPLANT
DRAPE LAPAROSCOPIC ABDOMINAL (DRAPES) ×3 IMPLANT
DRAPE UTILITY XL STRL (DRAPES) ×3 IMPLANT
DRAPE WARM FLUID 44X44 (DRAPE) ×3 IMPLANT
DRSG TEGADERM 4X4.75 (GAUZE/BANDAGES/DRESSINGS) ×3 IMPLANT
ELECT REM PT RETURN 9FT ADLT (ELECTROSURGICAL) ×3
ELECTRODE REM PT RTRN 9FT ADLT (ELECTROSURGICAL) ×1 IMPLANT
ENDOLOOP SUT PDS II  0 18 (SUTURE)
ENDOLOOP SUT PDS II 0 18 (SUTURE) IMPLANT
EVACUATOR SILICONE 100CC (DRAIN) IMPLANT
GAUZE SPONGE 2X2 8PLY STRL LF (GAUZE/BANDAGES/DRESSINGS) ×1 IMPLANT
GLOVE ECLIPSE 8.0 STRL XLNG CF (GLOVE) ×3 IMPLANT
GLOVE INDICATOR 8.0 STRL GRN (GLOVE) ×3 IMPLANT
GOWN STRL REUS W/TWL XL LVL3 (GOWN DISPOSABLE) ×6 IMPLANT
KIT BASIN OR (CUSTOM PROCEDURE TRAY) ×3 IMPLANT
NS IRRIG 1000ML POUR BTL (IV SOLUTION) ×3 IMPLANT
POUCH SPECIMEN RETRIEVAL 10MM (ENDOMECHANICALS) IMPLANT
SCISSORS LAP 5X35 DISP (ENDOMECHANICALS) IMPLANT
SET CHOLANGIOGRAPH MIX (MISCELLANEOUS) ×3 IMPLANT
SET IRRIG TUBING LAPAROSCOPIC (IRRIGATION / IRRIGATOR) ×3 IMPLANT
SHEARS HARMONIC ACE PLUS 36CM (ENDOMECHANICALS) ×3 IMPLANT
SPONGE GAUZE 2X2 STER 10/PKG (GAUZE/BANDAGES/DRESSINGS) ×2
SUT MNCRL AB 4-0 PS2 18 (SUTURE) ×3 IMPLANT
SUT PDS AB 0 CT1 36 (SUTURE) ×2 IMPLANT
SYR 20CC LL (SYRINGE) ×3 IMPLANT
TOWEL OR 17X26 10 PK STRL BLUE (TOWEL DISPOSABLE) ×3 IMPLANT
TOWEL OR NON WOVEN STRL DISP B (DISPOSABLE) ×3 IMPLANT
TRAY LAPAROSCOPIC (CUSTOM PROCEDURE TRAY) ×3 IMPLANT
TROCAR BLADELESS OPT 5 100 (ENDOMECHANICALS) ×3 IMPLANT
TROCAR BLADELESS OPT 5 150 (ENDOMECHANICALS) ×3 IMPLANT

## 2014-07-15 NOTE — Transfer of Care (Signed)
Immediate Anesthesia Transfer of Care Note  Patient: John Rich  Procedure(s) Performed: Procedure(s): LAPAROSCOPIC CHOLECYSTECTOMY SINGLE SITE umblical hernia repair (N/A)  Patient Location: PACU  Anesthesia Type:General  Level of Consciousness: awake, alert , oriented and patient cooperative  Airway & Oxygen Therapy: Patient Spontanous Breathing and Patient connected to face mask oxygen  Post-op Assessment: Report given to PACU RN, Post -op Vital signs reviewed and stable and Patient moving all extremities  Post vital signs: Reviewed and stable  Complications: No apparent anesthesia complications

## 2014-07-15 NOTE — Discharge Instructions (Signed)
Cholecystitis Cholecystitis is an inflammation of your gallbladder. It is usually caused by a buildup of gallstones or sludge (cholelithiasis) in your gallbladder. The gallbladder stores a fluid that helps digest fats (bile). Cholecystitis is serious and needs treatment right away.  CAUSES   Gallstones. Gallstones can block the tube that leads to your gallbladder, causing bile to build up. As bile builds up, the gallbladder becomes inflamed.  Bile duct problems, such as blockage from scarring or kinking.  Tumors. Tumors can stop bile from leaving your gallbladder correctly, causing bile to build up. As bile builds up, the gallbladder becomes inflamed. SYMPTOMS   Nausea.  Vomiting.  Abdominal pain, especially in the upper right area of your abdomen.  Abdominal tenderness or bloating.  Sweating.  Chills.  Fever.  Yellowing of the skin and the whites of the eyes (jaundice). DIAGNOSIS  Your caregiver may order blood tests to look for infection or gallbladder problems. Your caregiver may also order imaging tests, such as an ultrasound or computed tomography (CT) scan. Further tests may include a hepatobiliary iminodiacetic acid (HIDA) scan. This scan allows your caregiver to see your bile move from the liver to the gallbladder and to the small intestine. TREATMENT  A hospital stay is usually necessary to lessen the inflammation of your gallbladder. You may be required to not eat or drink (fast) for a certain amount of time. You may be given medicine to treat pain or an antibiotic medicine to treat an infection. Surgery may be needed to remove your gallbladder (cholecystectomy) once the inflammation has gone down. Surgery may be needed right away if you develop complications such as death of gallbladder tissue (gangrene) or a tear (perforation) of the gallbladder.  HOME CARE INSTRUCTIONS  Home care will depend on your treatment. In general:  If you were given antibiotics, take them as  directed. Finish them even if you start to feel better.  Only take over-the-counter or prescription medicines for pain, discomfort, or fever as directed by your caregiver.  Follow a low-fat diet until you see your caregiver again.  Keep all follow-up visits as directed by your caregiver. SEEK IMMEDIATE MEDICAL CARE IF:   Your pain is increasing and not controlled by medicines.  Your pain moves to another part of your abdomen or to your back.  You have a fever.  You have nausea and vomiting. MAKE SURE YOU:  Understand these instructions.  Will watch your condition.  Will get help right away if you are not doing well or get worse. Document Released: 06/21/2005 Document Revised: 09/13/2011 Document Reviewed: 05/07/2011 [facility] Patient Information 2015 [place], [place]. This information is not intended to replace advice given to you by your health care provider. Make sure you discuss any questions you have with your health care provider.  LAPAROSCOPIC SURGERY: POST OP INSTRUCTIONS  1. DIET: Follow a light bland diet the first 24 hours after arrival home, such as soup, liquids, crackers, etc.  Be sure to include lots of fluids daily.  Avoid fast food or heavy meals as your are more likely to get nauseated.  Eat a low fat the next few days after surgery.   2. Take your usually prescribed home medications unless otherwise directed. 3. PAIN CONTROL: a. Pain is best controlled by a usual combination of three different methods TOGETHER: i. Ice/Heat ii. Over the counter pain medication iii. Prescription pain medication b. Most patients will experience some swelling and bruising around the incisions.  Ice packs or heating pads (30-60 minutes  up to 6 times a day) will help. Use ice for the first few days to help decrease swelling and bruising, then switch to heat to help relax tight/sore spots and speed recovery.  Some people prefer to use ice alone, heat alone, alternating between ice & heat.   Experiment to what works for you.  Swelling and bruising can take several weeks to resolve.   c. It is helpful to take an over-the-counter pain medication regularly for the first few weeks.  Choose one of the following that works best for you: i. Naproxen (Aleve, etc)  Two  tabs twice a day ii. Ibuprofen (Advil, etc) Three  tabs four times a day (every meal & bedtime) iii. Acetaminophen (Tylenol, etc) 500-650mg  four times a day (every meal & bedtime) d. A  prescription for pain medication (such as oxycodone, hydrocodone, etc) should be given to you upon discharge.  Take your pain medication as prescribed.  i. If you are having problems/concerns with the prescription medicine (does not control pain, nausea, vomiting, rash, itching, etc), please call us [phone] to see if we need to switch you to a different pain medicine that will work better for you and/or control your side effect better. ii. If you need a refill on your pain medication, please contact your pharmacy.  They will contact our office to request authorization. Prescriptions will not be filled after 5 pm or on week-ends. 4. Avoid getting constipated.  Between the surgery and the pain medications, it is common to experience some constipation.  Increasing fluid intake and taking a fiber supplement (such as Metamucil, Citrucel, FiberCon, MiraLax, etc) 1-2 times a day regularly will usually help prevent this problem from occurring.  A mild laxative (prune juice, Milk of Magnesia, MiraLax, etc) should be taken according to package directions if there are no bowel movements after 48 hours.   5. Watch out for diarrhea.  If you have many loose bowel movements, simplify your diet to bland foods & liquids for a few days.  Stop any stool softeners and decrease your fiber supplement.  Switching to mild anti-diarrheal medications (Kayopectate, Pepto Bismol) can help.  If this worsens or does not improve, please call us. 6. Wash / shower every  day.  You may shower over the dressings as they are waterproof.  Continue to shower over incision(s) after the dressing is off. 7. Remove your waterproof bandages 5 days after surgery.  You may leave the incision open to air.  You may replace a dressing/Band-Aid to cover the incision for comfort if you wish.  8. ACTIVITIES as tolerated:   a. You may resume regular (light) daily activities beginning the next day--such as daily self-care, walking, climbing stairs--gradually increasing activities as tolerated.  If you can walk 30 minutes without difficulty, it is safe to try more intense activity such as jogging, treadmill, bicycling, low-impact aerobics, swimming, etc. b. Save the most intensive and strenuous activity for last such as sit-ups, heavy lifting, contact sports, etc  Refrain from any heavy lifting or straining until you are off narcotics for pain control.   c. DO NOT PUSH THROUGH PAIN.  Let pain be your guide: If it hurts to do something, don't do it.  Pain is your body warning you to avoid that activity for another week until the pain goes down. d. You may drive when you are no longer taking prescription pain medication, you can comfortably wear a seatbelt, and you can safely maneuver your car and apply brakes.  e. Bonita Quin may have sexual intercourse when it is comfortable.  9. FOLLOW UP in our office a. Please call [facility] at [phone] to set up an appointment to see your surgeon in the office for a follow-up appointment approximately 2-3 weeks after your surgery. b. Make sure that you call for this appointment the day you arrive home to insure a convenient appointment time. 10. IF YOU HAVE DISABILITY OR FAMILY LEAVE FORMS, BRING THEM TO THE OFFICE FOR PROCESSING.  DO NOT GIVE THEM TO YOUR DOCTOR.   WHEN TO CALL us [phone]: 1. Poor pain control 2. Reactions / problems with new medications (rash/itching, nausea, etc)  3. Fever over 101.5 F (38.5 C) 4. Inability to urinate 5. Nausea  and/or vomiting 6. Worsening swelling or bruising 7. Continued bleeding from incision. 8. Increased pain, redness, or drainage from the incision   The clinic staff is available to answer your questions during regular business hours (8:30am-5pm).  Please dont hesitate to call and ask to speak to one of our nurses for clinical concerns.   If you have a medical emergency, go to the nearest emergency room or call 911.  A surgeon from [facility] is always on call at the [facility], [place] [address], [address], [place], [place]  [address] ? MAIN: [phone] ? TOLL FREE: [phone] ?  FAX [phone] [email or web address]  GETTING TO GOOD BOWEL HEALTH. Irregular bowel habits such as constipation and diarrhea can lead to many problems over time.  Having one soft bowel movement a day is the most important way to prevent further problems.  The anorectal canal is designed to handle stretching and feces to safely manage our ability to get rid of solid waste (feces, poop, stool) out of our body.  BUT, hard constipated stools can act like ripping concrete bricks and diarrhea can be a burning fire to this very sensitive area of our body, causing inflamed hemorrhoids, anal fissures, increasing risk is perirectal abscesses, abdominal pain/bloating, an making irritable bowel worse.     The goal: ONE SOFT BOWEL MOVEMENT A DAY!  To have soft, regular bowel movements:   Drink at least 8 tall glasses of water a day.    Take plenty of fiber.  Fiber is the undigested part of plant food that passes into the colon, acting s natures broom to encourage bowel motility and movement.  Fiber can absorb and hold large amounts of water. This results in a larger, bulkier stool, which is soft and easier to pass. Work gradually over several weeks up to 6 servings a day of fiber (25g a day even more if needed) in the form of: o Vegetables -- Root (potatoes,  carrots, turnips), leafy green (lettuce, salad greens, celery, spinach), or cooked high residue (cabbage, broccoli, etc) o Fruit -- Fresh (unpeeled skin & pulp), Dried (prunes, apricots, cherries, etc ),  or stewed ( applesauce)  o Whole grain breads, pasta, etc (whole wheat)  o Bran cereals   Bulking Agents -- This type of water-retaining fiber generally is easily obtained each day by one of the following:  o Psyllium bran -- The psyllium plant is remarkable because its ground seeds can retain so much water. This product is available as Metamucil, Konsyl, Effersyllium, Per Diem Fiber, or the less expensive generic preparation in drug and health food stores. Although labeled a laxative, it really is not a laxative.  o Methylcellulose -- This is another fiber derived from  wood which also retains water. It is available as Citrucel. o Polyethylene Glycol - and artificial fiber commonly called Miralax or Glycolax.  It is helpful for people with gassy or bloated feelings with regular fiber o Flax Seed - a less gassy fiber than psyllium  No reading or other relaxing activity while on the toilet. If bowel movements take longer than 5 minutes, you are too constipated  AVOID CONSTIPATION.  High fiber and water intake usually takes care of this.  Sometimes a laxative is needed to stimulate more frequent bowel movements, but   Laxatives are not a good long-term solution as it can wear the colon out. o Osmotics (Milk of Magnesia, Fleets phosphosoda, Magnesium citrate, MiraLax, GoLytely) are safer than  o Stimulants (Senokot, Castor Oil, Dulcolax, Ex Lax)    o Do not take laxatives for more than 7days in a row.   IF SEVERELY CONSTIPATED, try a Bowel Retraining Program: o Do not use laxatives.  o Eat a diet high in roughage, such as bran cereals and leafy vegetables.  o Drink six (6) ounces of prune or apricot juice each morning.  o Eat two (2) large servings of stewed fruit each day.  o Take one (1)  heaping tablespoon of a psyllium-based bulking agent twice a day. Use sugar-free sweetener when possible to avoid excessive calories.  o Eat a normal breakfast.  o Set aside 15 minutes after breakfast to sit on the toilet, but do not strain to have a bowel movement.  o If you do not have a bowel movement by the third day, use an enema and repeat the above steps.   Controlling diarrhea o Switch to liquids and simpler foods for a few days to avoid stressing your intestines further. o Avoid dairy products (especially milk & ice cream) for a short time.  The intestines often can lose the ability to digest lactose when stressed. o Avoid foods that cause gassiness or bloating.  Typical foods include beans and other legumes, cabbage, broccoli, and dairy foods.  Every person has some sensitivity to other foods, so listen to our body and avoid those foods that trigger problems for you. o Adding fiber (Citrucel, Metamucil, psyllium, Miralax) gradually can help thicken stools by absorbing excess fluid and retrain the intestines to act more normally.  Slowly increase the dose over a few weeks.  Too much fiber too soon can backfire and cause cramping & bloating. o Probiotics (such as active yogurt, Align, etc) may help repopulate the intestines and colon with normal bacteria and calm down a sensitive digestive tract.  Most studies show it to be of mild help, though, and such products can be costly. o Medicines: - Bismuth subsalicylate (ex. Kayopectate, Pepto Bismol) every 30 minutes for up to 6 doses can help control diarrhea.  Avoid if pregnant. - Loperamide (Immodium) can slow down diarrhea.  Start with two tablets (4mg  total) first and then try one tablet every 6 hours.  Avoid if you are having fevers or severe pain.  If you are not better or start feeling worse, stop all medicines and call your doctor for advice o Call your doctor if you are getting worse or not better.  Sometimes further testing (cultures,  endoscopy, X-ray studies, bloodwork, etc) may be needed to help diagnose and treat the cause of the diarrhea.  Managing Pain  Pain after surgery or related to activity is often due to strain/injury to muscle, tendon, nerves and/or incisions.  This pain is usually short-term  and will improve in a few months.   Many people find it helpful to do the following things TOGETHER to help speed the process of healing and to get back to regular activity more quickly:  1. Avoid heavy physical activity a.  no lifting greater than 20 pounds b. Do not push through the pain.  Listen to your body and avoid positions and maneuvers than reproduce the pain c. Walking is okay as tolerated, but go slowly and stop when getting sore.  d. Remember: If it hurts to do it, then dont do it! 2. Take Anti-inflammatory medication  a. Take with food/snack around the clock for 1-2 weeks i. This helps the muscle and nerve tissues become less irritable and calm down faster b. Choose ONE of the following over-the-counter medications: i. Naproxen 220mg  tabs (ex. Aleve) 1-2 pills twice a day  ii. Ibuprofen 200mg  tabs (ex. Advil, Motrin) 3-4 pills with every meal and just before bedtime iii. Acetaminophen 500mg  tabs (Tylenol) 1-2 pills with every meal and just before bedtime 3. Use a Heating pad or Ice/Cold Pack a. 4-6 times a day b. May use warm bath/hottub  or showers 4. Try Gentle Massage and/or Stretching  a. at the area of pain many times a day b. stop if you feel pain - do not overdo it  Try these steps together to help you body heal faster and avoid making things get worse.  Doing just one of these things may not be enough.    If you are not getting better after two weeks or are noticing you are getting worse, contact our office for further advice; we may need to re-evaluate you & see what other things we can do to help.

## 2014-07-15 NOTE — ED Notes (Signed)
Bed: [id number] Expected date: 07/15/14 Expected time: 12:25 AM Means of arrival: Car Comments: Transfer from med center

## 2014-07-15 NOTE — Anesthesia Procedure Notes (Signed)
Procedure Name: Intubation Date/Time: 07/15/2014 7:13 PM Performed by: Edison PaceGRAY, Lord Lancour E Pre-anesthesia Checklist: Patient identified, Timeout performed, Emergency Drugs available, Suction available and Patient being monitored Patient Re-evaluated:Patient Re-evaluated prior to inductionOxygen Delivery Method: Circle system utilized Preoxygenation: Pre-oxygenation with 100% oxygen Intubation Type: IV induction and Cricoid Pressure applied Ventilation: Mask ventilation without difficulty Laryngoscope Size: Mac and 4 Grade View: Grade III Tube type: Oral Tube size: 7.5 mm Number of attempts: 2 Airway Equipment and Method: Bougie stylet Placement Confirmation: positive ETCO2 and breath sounds checked- equal and bilateral Secured at: 21 cm Tube secured with: Tape Dental Injury: Teeth and Oropharynx as per pre-operative assessment  Difficulty Due To: Difficulty was anticipated, Difficult Airway- due to large tongue, Difficult Airway- due to reduced neck mobility, Difficult Airway- due to dentition, Difficult Airway- due to limited oral opening and Difficult Airway- due to anterior larynx Future Recommendations: Recommend- induction with short-acting agent, and alternative techniques readily available Comments: Unable to expose cords with cricoid press/head lift large epiglottis, tonsils.

## 2014-07-15 NOTE — ED Provider Notes (Signed)
1:50 AM This is a 44 year old male who [facility] after workup indicated early cholecystitis. He is to be seen by Dr. Carolynne Edouardoth. Dr. Carolynne Edouardoth is presently in the operating room. The patient states he is comfortable at the present time and not in need of any pain or nausea medication.  The patient is awake and alert and in no acute distress. His abdomen is soft with right upper quadrant tenderness.    John SeamenJohn L Leni Pankonin, MD 07/15/14 [phone]

## 2014-07-15 NOTE — Anesthesia Postprocedure Evaluation (Signed)
Anesthesia Post Note  Patient: John Rich  Procedure(s) Performed: Procedure(s) (LRB): LAPAROSCOPIC CHOLECYSTECTOMY SINGLE SITE umblical hernia repair (N/A)  Anesthesia type: General  Patient location: PACU  Post pain: Pain level controlled  Post assessment: Post-op Vital signs reviewed  Last Vitals: BP 133/72 mmHg  Pulse 80  Temp(Src) 36.8 C (Oral)  Resp 16  Ht 5\' 11"  (1.803 m)  Wt 220 lb 10.9 oz (100.1 kg)  BMI 30.79 kg/m2  SpO2 97%  Post vital signs: Reviewed  Level of consciousness: sedated  Complications: No apparent anesthesia complications

## 2014-07-15 NOTE — Op Note (Signed)
07/14/2014 - 07/15/2014  8:32 PM  PATIENT:  John Rich  [age] y.o. male  No care team member to display  PRE-OPERATIVE DIAGNOSIS:  cholecystitis  POST-OPERATIVE DIAGNOSIS:    Acute calculus cholecystitis Umbilical hernia  PROCEDURE:  Procedure(s): LAPAROSCOPIC CHOLECYSTECTOMY SINGLE SITE  PRIMARY UMBILICAL HERNIA REPAIRr  SURGEON:  Surgeon(s): Karie SodaSteven Shellee Streng, MD  ASSISTANT: none   ANESTHESIA:   local and general  EBL:     Delay start of Pharmacological VTE agent (>24hrs) due to surgical blood loss or risk of bleeding:  no  DRAINS:  none   SPECIMEN:  Source of Specimen:   Gallbladder   DISPOSITION OF SPECIMEN:  PATHOLOGY  COUNTS:  YES  PLAN OF CARE: Admit for overnight observation  PATIENT DISPOSITION:  PACU - hemodynamically stable.  INDICATION: Patient with cholecystitis  The anatomy & physiology of hepatobiliary & pancreatic function was discussed.  The pathophysiology of gallbladder dysfunction was discussed.  Natural history risks without surgery was discussed.   I feel the risks of no intervention will lead to serious problems that outweigh the operative risks; therefore, I recommended cholecystectomy to remove the pathology.  I explained laparoscopic techniques with possible need for an open approach.  Probable cholangiogram to evaluate the bilary tract was explained as well.    Risks such as bleeding, infection, abscess, leak, injury to other organs, need for further treatment, heart attack, death, and other risks were discussed.  I noted a good likelihood this will help address the problem.  Possibility that this will not correct all abdominal symptoms was explained.  Goals of post-operative recovery were discussed as well.  We will work to minimize complications.  An educational handout further explaining the pathology and treatment options was given as well.  Questions were answered.  The patient expresses understanding & wishes to proceed with surgery.   OR  FINDINGS: inflammed gallbladder with GB wall thickening.  DESCRIPTION:   The patient was identified & brought in the operating room. The patient was positioned supine with arms tucked. SCDs were active during the entire case. The patient underwent general anesthesia without any difficulty.  The abdomen was prepped and draped in a sterile fashion. A Surgical Timeout confirmed our plan.  I made a transverse curvilinear incision through the superior umbilical fold.  I noted a 1 cm hernia incarcerated with omentum - I reduced the hernia.  I placed a 5mm long port through the umbilical hernia using a modified Hassan cutdown technique. I began carbon dioxide insufflation. Camera inspection revealed no injury. There were no adhesions to the anterior abdominal wall supraumbilically.  I proceeded to continue with single site technique. I placed a #5 port in left upper aspect of the wound. I placed a 5 mm atraumatic grasper in the right inferior aspect of the wound.  I turned attention to the right upper quadrant.  No major abd wall adhesions.  The gallbladder fundus was elevated cephalad. I freed the omental & peritoneal coverings between the gallbladder and the liver on the posteriolateral and anteriomedial walls. I alternated between Harmonic & blunt Maryland dissection to help get a good critical view of the cystic artery and cystic duct. I did further dissection to free a few centimeters of the gallbladder off the liver bed to get a good critical view of the infundibulum and cystic duct. I mobilized the cystic artery; and, after getting a good 360 view, ligated the cystic artery using the Harmonic ultrasonic dissection. I skeletonized the cystic duct.  I placed  a clip on the infundibulum. I did a partial cystic duct-otomy and ensured patency. I placed a 5 [place] cholangiocatheter through a puncture site at the right subcostal ridge of the abdominal wall and directed it into the cystic duct.  However the duct  was very strictured.  After a recut more proximally, the gallbladder came off.  Therefore I did not do cholangiography.  I removed the cholangiocatheter. I placed clips on the cystic duct x2.  I placed a 0-PDS Endoloop around the cystic duct stump to good effect.    I freed the gallbladder from its remaining attachments to the liver. I ensured hemostasis on the gallbladder fossa of the liver and elsewhere. I inspected the rest of the abdomen & detected no injury nor bleeding elsewhere.  I removed the gallbladder out the umbilical hernia. I closed the fascia transversely using 0 PDS interrupted stitches. I reattached the umbilical stalk back to the fascia using 0-vicryl suture.  I closed the skin using 4-0 monocryl stitch.  Sterile dressing was applied. The patient was extubated & arrived in the PACU in stable condition..  I had discussed postoperative care with the patient in the holding area. I am about to locate the patient's family and discuss operative findings and postoperative goals / instructions.  Instructions are written in the chart as well.  Ardeth Sportsman, M.D., F.A.C.S. Gastrointestinal and Minimally Invasive Surgery [facility] [address], [address] [place], [place] [address] [phone] Main / Paging

## 2014-07-15 NOTE — H&P (Signed)
John Rich is an 44 y.o. male.   Chief Complaint: abdominal pain HPI: The pt is a 44yo wm who presents with RUQ abdominal pain that started In the last couple days. He states he has been having this pain about once a week for the last 2-3 months. The pain is associated with nausea but no vomiting. The pain was severe enough to cause him to go to the emergency department. An ultrasound was done which showed some thickening of the gallbladder wall and stones in the gallbladder as well. His liver functions were normal. Because his nausea would not resolve he was transferred to [facility] for further evaluation  Past Medical History  Diagnosis Date  . Sleep apnea   . CPAP (continuous positive airway pressure) dependence     Past Surgical History  Procedure Laterality Date  . Tonsillectomy      History reviewed. No pertinent family history. Social History:  reports that he has never smoked. He does not have any smokeless tobacco history on file. He reports that he drinks alcohol. His drug history is not on file.  Allergies:  Allergies  Allergen Reactions  . Zithromax [Azithromycin] Other (See Comments)    Unknown reaction     (Not in a hospital admission)  Results for orders placed or performed during the hospital encounter of 07/14/14 (from the past 48 hour(s))  Lipase, blood     Status: None   Collection Time: 07/14/14 11:10 PM  Result Value Ref Range   Lipase 34 11 - 59 U/L  Comprehensive metabolic panel     Status: Abnormal   Collection Time: 07/14/14 11:10 PM  Result Value Ref Range   Sodium 135 135 - 145 mmol/L    Comment: Please note change in reference range.   Potassium 3.9 3.5 - 5.1 mmol/L    Comment: Please note change in reference range.   Chloride 103 96 - 112 mEq/L   CO2 24 19 - 32 mmol/L   Glucose, Bld 114 (H) 70 - 99 mg/dL   BUN 27 (H) 6 - 23 mg/dL   Creatinine, Ser 0.91 0.50 - 1.35 mg/dL   Calcium 9.3 8.4 - 10.5 mg/dL   Total Protein 8.1 6.0 - 8.3 g/dL    Albumin 4.8 3.5 - 5.2 g/dL   AST 21 0 - 37 U/L   ALT 30 0 - 53 U/L   Alkaline Phosphatase 95 39 - 117 U/L   Total Bilirubin 0.8 0.3 - 1.2 mg/dL   GFR calc non Af Amer >90 >90 mL/min   GFR calc Af Amer >90 >90 mL/min    Comment: (NOTE) The eGFR has been calculated using the CKD EPI equation. This calculation has not been validated in all clinical situations. eGFR's persistently <90 mL/min signify possible Chronic Kidney Disease.    Anion gap 8 5 - 15  CBC with Differential     Status: Abnormal   Collection Time: 07/14/14 11:10 PM  Result Value Ref Range   WBC 14.6 (H) 4.0 - 10.5 K/uL   RBC 5.16 4.22 - 5.81 MIL/uL   Hemoglobin 14.9 13.0 - 17.0 g/dL   HCT 43.4 39.0 - 52.0 %   MCV 84.1 78.0 - 100.0 fL   MCH 28.9 26.0 - 34.0 pg   MCHC 34.3 30.0 - 36.0 g/dL   RDW 13.2 11.5 - 15.5 %   Platelets 203 150 - 400 K/uL   Neutrophils Relative % 79 (H) 43 - 77 %   Neutro Abs 11.5 (  H) 1.7 - 7.7 K/uL   Lymphocytes Relative 14 12 - 46 %   Lymphs Abs 2.0 0.7 - 4.0 K/uL   Monocytes Relative 7 3 - 12 %   Monocytes Absolute 1.0 0.1 - 1.0 K/uL   Eosinophils Relative 0 0 - 5 %   Eosinophils Absolute 0.1 0.0 - 0.7 K/uL   Basophils Relative 0 0 - 1 %   Basophils Absolute 0.0 0.0 - 0.1 K/uL   US Abdomen Complete  07/14/2014   CLINICAL DATA:  Acute onset of upper quadrant abdominal pain and right flank pain. Back pain. Nausea, vomiting and chills. Initial encounter.  EXAM: ULTRASOUND ABDOMEN COMPLETE  COMPARISON:  CT of the abdomen and pelvis from 06/26/2014  FINDINGS: Gallbladder: Vague echogenic material within the gallbladder moves in a granular fashion, thought to reflect numerous tiny stones. A few larger stones are seen, measuring up to 1.0 cm in size. There is mild gallbladder wall thickening, measuring up to 4 mm, without definite pericholecystic fluid. No ultrasonographic Murphy's sign elicited, though the patient does have relatively significant focal tenderness.  Common bile duct: Diameter:  0.2 cm, within normal limits in caliber.  Liver: No focal lesion identified. Within normal limits in parenchymal echogenicity.  IVC: No abnormality visualized.  Pancreas: Visualized portion unremarkable.  Spleen: Size and appearance within normal limits.  Right Kidney: Length: 12.6 cm. Echogenicity within normal limits. No mass or hydronephrosis visualized.  Left Kidney: Length: 13.3 cm. Echogenicity within normal limits. A small 1.4 cm cyst is noted at the interpole region of the left kidney, with minimal associated peripheral calcification. No evidence of hydronephrosis.  Abdominal aorta: No aneurysm visualized. The mid abdominal aorta is partially obscured by overlying bowel gas.  Other findings: None.  IMPRESSION: 1. Mild gallbladder wall thickening, with gravel and stones in the gallbladder. Significant focal tenderness at the gallbladder, despite the lack of an ultrasonographic Murphy's sign. This could reflect mild acute cholecystitis. No evidence for distal obstruction. 2. Small left renal cyst, with minimal peripheral calcification.   Electronically Signed   By: Garald Balding M.D.   On: 07/14/2014 23:01    Review of Systems  Constitutional: Negative.   HENT: Negative.   Eyes: Negative.   Respiratory: Negative.   Cardiovascular: Negative.   Gastrointestinal: Positive for nausea and abdominal pain.  Genitourinary: Negative.   Musculoskeletal: Positive for back pain.  Skin: Negative.   Neurological: Negative.   Endo/Heme/Allergies: Negative.   Psychiatric/Behavioral: Negative.     Blood pressure 133/71, pulse 67, temperature 98.2 F (36.8 C), temperature source Oral, resp. rate 18, height 5' 11"  (1.803 m), weight 220 lb (99.791 kg), SpO2 99 %. Physical Exam  Constitutional: He is oriented to person, place, and time. He appears well-developed and well-nourished.  HENT:  Head: Normocephalic and atraumatic.  Eyes: Conjunctivae and EOM are normal. Pupils are equal, round, and reactive to  light.  Neck: Normal range of motion. Neck supple.  Cardiovascular: Normal rate, regular rhythm and normal heart sounds.   Respiratory: Effort normal and breath sounds normal.  GI: Soft. Bowel sounds are normal.  There is mild RUQ pain. No palpable mass  Musculoskeletal: Normal range of motion.  Neurological: He is alert and oriented to person, place, and time.  Skin: Skin is warm and dry.  Psychiatric: He has a normal mood and affect. His behavior is normal.     Assessment/Plan The patient appears to have cholecystitis with cholelithiasis. Because of the risk of further painful episodes and possible pancreatitis  I think he would benefit from having his gallbladder removed. He would also like to have this done. I have discussed with himThe risks and benefits of the operation to remove the gallbladder as well as some of the technical aspects and he understands and wishes to proceed. We will admit to the hospital and discussed with the primary team in the morning.  TOTH III,Kolbe Delmonaco S 07/15/2014, 3:51 AM

## 2014-07-15 NOTE — Progress Notes (Signed)
John  [place]., John Rich, John Rich Phone: [phone] FAX: [phone]    John Rich [id number] 09/09/1970  CARE TEAM:  PCP: No primary care provider on file.  Outpatient Care Team: No care team member to display  Inpatient Treatment Team: Treatment Team: Attending Provider: Nolon Nations, MD; Attending Physician: Md John Pace, MD; Registered Nurse: John Oppenheim, RN; Technician: John Rich, NT; Respiratory Therapist: Gonzella Rich, RRT   Subjective:  No major events  Wife at bedside.  Still with right upper quadrant abdominal pain.  Objective:  Vital signs:  Filed Vitals:   07/15/14 0043 07/15/14 0130 07/15/14 0436 07/15/14 0509  BP: 137/77 133/71 120/63 116/70  Pulse: 69 67 67 63  Temp: 98 F (36.7 C) 98.2 F (36.8 C) 98.2 F (36.8 C) 97.6 F (36.4 C)  TempSrc: Oral Oral Oral Oral  Resp: 16 18 18 18   Height:    5' 11"  (1.803 m)  Weight:    220 lb 10.9 oz (100.1 kg)  SpO2: 98% 99% 97% 98%    Last BM Date: 07/14/14  Intake/Output   Yesterday:  01/10 0701 - 01/11 0700 In: 0  Out: 200 [Urine:200] This shift:     Bowel function:  Flatus: y  BM: n  Drain: n/a  Physical Exam:  General: Pt awake/alert/oriented x4 in no acute distress Eyes: PERRL, normal EOM.  Sclera clear.  No icterus Neuro: CN II-XII intact w/o focal sensory/motor deficits. Lymph: No head/neck/groin lymphadenopathy Psych:  No delerium/psychosis/paranoia HENT: Normocephalic, Mucus membranes moist.  No thrush Neck: Supple, No tracheal deviation Chest: No chest wall pain w good excursion CV:  Pulses intact.  Regular rhythm MS: Normal AROM mjr joints.  No obvious deformity Abdomen: Soft.  Nondistended.  Mod RUQ TTP only.  Rest of abdomen nontender.  No evidence of peritonitis.  No incarcerated hernias. Ext:  SCDs BLE.  No mjr edema.  No cyanosis Skin: No petechiae / purpura   Problem List:    Active Problems:   Gallstones   Assessment  John Rich  44 y.o. male       Acute cholecystitis with persistent pain  Plan:  Continue IV antibiotics.  Plan laparoscopic cholecystectomy soon.  Hopefully today.  I discussed with the patient and his wife:  The anatomy & physiology of hepatobiliary & pancreatic function was discussed.  The pathophysiology of gallbladder dysfunction was discussed.  Natural history risks without surgery was discussed.   I feel the risks of no intervention will lead to serious problems that outweigh the operative risks; therefore, I recommended cholecystectomy to remove the pathology.  I explained laparoscopic techniques with possible need for an open approach.  Probable cholangiogram to evaluate the bilary tract was explained as well.    Risks such as bleeding, infection, abscess, leak, injury to other organs, need for further treatment, stroke, heart attack, death, and other risks were discussed.  I noted a good likelihood this will help address the problem.  Possibility that this will not correct all abdominal symptoms was explained.  Goals of post-operative recovery were discussed as well.  We will work to minimize complications.  An educational handout further explaining the pathology and treatment options was given as well.  Questions were answered.  The patient & his wife express understanding & wish to proceed with surgery.  -VTE prophylaxis- SCDs, etc -mobilize as tolerated to help recovery  John Rich, M.D., F.A.C.S. Gastrointestinal and Minimally Invasive Surgery [facility] [address], [place], [place] [address] [phone] Main / Paging   07/15/2014   Results:   Labs: Results for orders placed or performed during the hospital encounter of 07/14/14 (from the past 48 hour(s))  Lipase, blood     Status: None   Collection Time: 07/14/14 11:10 PM  Result Value Ref Range   Lipase 34 11 - 59  U/L  Comprehensive metabolic panel     Status: Abnormal   Collection Time: 07/14/14 11:10 PM  Result Value Ref Range   Sodium 135 135 - 145 mmol/L    Comment: Please note change in reference range.   Potassium 3.9 3.5 - 5.1 mmol/L    Comment: Please note change in reference range.   Chloride 103 96 - 112 mEq/L   CO2 24 19 - 32 mmol/L   Glucose, Bld 114 (H) 70 - 99 mg/dL   BUN 27 (H) 6 - 23 mg/dL   Creatinine, Ser 0.91 0.50 - 1.35 mg/dL   Calcium 9.3 8.4 - 10.5 mg/dL   Total Protein 8.1 6.0 - 8.3 g/dL   Albumin 4.8 3.5 - 5.2 g/dL   AST 21 0 - 37 U/L   ALT 30 0 - 53 U/L   Alkaline Phosphatase 95 39 - 117 U/L   Total Bilirubin 0.8 0.3 - 1.2 mg/dL   GFR calc non Af Amer >90 >90 mL/min   GFR calc Af Amer >90 >90 mL/min    Comment: (NOTE) The eGFR has been calculated using the CKD EPI equation. This calculation has not been validated in all clinical situations. eGFR's persistently <90 mL/min signify possible Chronic Kidney Disease.    Anion gap 8 5 - 15  CBC with Differential     Status: Abnormal   Collection Time: 07/14/14 11:10 PM  Result Value Ref Range   WBC 14.6 (H) 4.0 - 10.5 K/uL   RBC 5.16 4.22 - 5.81 MIL/uL   Hemoglobin 14.9 13.0 - 17.0 g/dL   HCT 43.4 39.0 - 52.0 %   MCV 84.1 78.0 - 100.0 fL   MCH 28.9 26.0 - 34.0 pg   MCHC 34.3 30.0 - 36.0 g/dL   RDW 13.2 11.5 - 15.5 %   Platelets 203 150 - 400 K/uL   Neutrophils Relative % 79 (H) 43 - 77 %   Neutro Abs 11.5 (H) 1.7 - 7.7 K/uL   Lymphocytes Relative 14 12 - 46 %   Lymphs Abs 2.0 0.7 - 4.0 K/uL   Monocytes Relative 7 3 - 12 %   Monocytes Absolute 1.0 0.1 - 1.0 K/uL   Eosinophils Relative 0 0 - 5 %   Eosinophils Absolute 0.1 0.0 - 0.7 K/uL   Basophils Relative 0 0 - 1 %   Basophils Absolute 0.0 0.0 - 0.1 K/uL    Imaging / Studies: US Abdomen Complete  07/14/2014   CLINICAL DATA:  Acute onset of upper quadrant abdominal pain and right flank pain. Back pain. Nausea, vomiting and chills. Initial encounter.   EXAM: ULTRASOUND ABDOMEN COMPLETE  COMPARISON:  CT of the abdomen and pelvis from 06/26/2014  FINDINGS: Gallbladder: Vague echogenic material within the gallbladder moves in a granular fashion, thought to reflect numerous tiny stones. A few larger stones are seen, measuring up to 1.0 cm in size. There is mild gallbladder wall thickening, measuring up to 4 mm, without definite pericholecystic fluid. No ultrasonographic Murphy's sign elicited, though the patient does have relatively significant focal tenderness.  Common bile  duct: Diameter: 0.2 cm, within normal limits in caliber.  Liver: No focal lesion identified. Within normal limits in parenchymal echogenicity.  IVC: No abnormality visualized.  Pancreas: Visualized portion unremarkable.  Spleen: Size and appearance within normal limits.  Right Kidney: Length: 12.6 cm. Echogenicity within normal limits. No mass or hydronephrosis visualized.  Left Kidney: Length: 13.3 cm. Echogenicity within normal limits. A small 1.4 cm cyst is noted at the interpole region of the left kidney, with minimal associated peripheral calcification. No evidence of hydronephrosis.  Abdominal aorta: No aneurysm visualized. The mid abdominal aorta is partially obscured by overlying bowel gas.  Other findings: None.  IMPRESSION: 1. Mild gallbladder wall thickening, with gravel and stones in the gallbladder. Significant focal tenderness at the gallbladder, despite the lack of an ultrasonographic Murphy's sign. This could reflect mild acute cholecystitis. No evidence for distal obstruction. 2. Small left renal cyst, with minimal peripheral calcification.   Electronically Signed   By: Garald Balding M.D.   On: 07/14/2014 23:01    Medications / Allergies: per chart  Antibiotics: Anti-infectives    Start     Dose/Rate Route Frequency Ordered Stop   07/15/14 0800  cefTRIAXone (ROCEPHIN) 2 g in dextrose 5 % 50 mL IVPB - Premix    Comments:  Pharmacy may adjust dosing strength / duration /  interval for maximal efficacy   2 g100 mL/hr over 30 Minutes Intravenous Every 24 hours 07/15/14 0759     07/15/14 0000  ampicillin-sulbactam (UNASYN) 1.5 g in sodium chloride 0.9 % 50 mL IVPB     1.5 g100 mL/hr over 30 Minutes Intravenous  Once 07/14/14 2353 07/15/14 0107       Note: Portions of this report may have been transcribed using voice recognition software. Every effort was made to ensure accuracy; however, inadvertent computerized transcription errors may be present.   Any transcriptional errors that result from this process are unintentional.

## 2014-07-15 NOTE — Anesthesia Preprocedure Evaluation (Signed)
Anesthesia Evaluation  Patient identified by MRN, date of birth, ID band Patient awake    Reviewed: Allergy & Precautions, NPO status , Patient's Chart, lab work & pertinent test results  Airway Mallampati: II  TM Distance: >3 FB Neck ROM: Full    Dental no notable dental hx.    Pulmonary sleep apnea ,  breath sounds clear to auscultation  Pulmonary exam normal       Cardiovascular negative cardio ROS  Rhythm:Regular Rate:Normal     Neuro/Psych negative neurological ROS  negative psych ROS   GI/Hepatic negative GI ROS, Neg liver ROS,   Endo/Other  negative endocrine ROS  Renal/GU negative Renal ROS     Musculoskeletal negative musculoskeletal ROS (+)   Abdominal (+) + obese,   Peds  Hematology negative hematology ROS (+)   Anesthesia Other Findings   Reproductive/Obstetrics negative OB ROS                             Anesthesia Physical Anesthesia Plan  ASA: II  Anesthesia Plan: General   Post-op Pain Management:    Induction: Intravenous  Airway Management Planned: Oral ETT  Additional Equipment: None  Intra-op Plan:   Post-operative Plan: Extubation in OR  Informed Consent: I have reviewed the patients History and Physical, chart, labs and discussed the procedure including the risks, benefits and alternatives for the proposed anesthesia with the patient or authorized representative who has indicated his/her understanding and acceptance.   Dental advisory given  Plan Discussed with: CRNA  Anesthesia Plan Comments:         Anesthesia Quick Evaluation

## 2014-07-15 NOTE — ED Notes (Signed)
[facility] [place] [address] [place] Patient ambulated to room with no difficulties. Spouse with patient. IV still present and intact.

## 2014-07-15 NOTE — Progress Notes (Signed)
UR completed 

## 2014-07-16 ENCOUNTER — Encounter (HOSPITAL_COMMUNITY): Payer: Self-pay | Admitting: Surgery

## 2014-07-16 DIAGNOSIS — G4733 Obstructive sleep apnea (adult) (pediatric): Secondary | ICD-10-CM

## 2014-07-16 DIAGNOSIS — K429 Umbilical hernia without obstruction or gangrene: Secondary | ICD-10-CM

## 2014-07-16 HISTORY — DX: Obstructive sleep apnea (adult) (pediatric): G47.33

## 2014-07-16 HISTORY — DX: Umbilical hernia without obstruction or gangrene: K42.9

## 2014-07-16 MED ORDER — OXYCODONE HCL 5 MG PO TABS: ORAL_TABLET | ORAL | Status: AC

## 2014-07-16 MED ORDER — ACETAMINOPHEN 325 MG PO TABS: 325.0000 mg | ORAL_TABLET | Freq: Four times a day (QID) | ORAL | Status: AC | PRN

## 2014-07-16 NOTE — Discharge Summary (Signed)
Physician Discharge Summary  Patient ID: John Rich MRN: [id number] DOB/AGE: 45/03/1971 [age] y.o.  Admit date: 07/14/2014 Discharge date: 07/16/2014  Admission Diagnoses:  Acute cholecystitis, cholelithiasis Umbilical hernia Sleep apnea with CPAP Body mass index is 30.79    Discharge Diagnoses:  Same   Principal Problem:   Acute calculous cholecystitis Active Problems:   Umbilical hernia   Obstructive sleep apnea   PROCEDURES: S/p LAPAROSCOPIC CHOLECYSTECTOMY SINGLE SITE  PRIMARY UMBILICAL HERNIA REPAIR, 07/15/14, Dr. Chelsea AusGross  Hospital Course: The pt is a 43yo wm who presents with RUQ abdominal pain that started In the last couple days. He states he has been having this pain about once a week for the last 2-3 months. The pain is associated with nausea but no vomiting. The pain was severe enough to cause him to go to the emergency department. An ultrasound was done which showed some thickening of the gallbladder wall and stones in the gallbladder as well. His liver functions were normal. Because his nausea would not resolve [facility]  Pt was admitted and taken later that day to the OR for cholecystectomy.  He has done well post op.  Sites are dry.  He is advancing his diet and if OK on oral pain medicines he can go home later this afternoon.    Condition on d/c:  Improved    Disposition: 01-Home or Self Care     Medication List    STOP taking these medications        meloxicam 7.5 MG tablet  Commonly known as:  MOBIC     methocarbamol 500 MG tablet  Commonly known as:  ROBAXIN     orphenadrine 100 MG tablet  Commonly known as:  NORFLEX     oxyCODONE-acetaminophen 5-325 MG per tablet  Commonly known as:  PERCOCET      TAKE these medications        acetaminophen 325 MG tablet  Commonly known as:  TYLENOL  Take 1-2 tablets (325-650 mg total) by mouth every 6 (six) hours as needed for fever, headache, mild pain or  moderate pain.     naproxen 500 MG tablet  Commonly known as:  NAPROSYN  Take 1 tablet (500 mg total) by mouth 2 (two) times daily.     oxyCODONE 5 MG immediate release tablet  Commonly known as:  Oxy IR/ROXICODONE  - You can take 1-2 tablets every 6 hours.  You can take it alone or with Tylenol or Naprosyn.    - No driving while on pain meds.       Follow-up Information    [facility]   Why:  To follow up after your operation, To follow up after your hospital stay.  Be at the 1:45 PM for check in.  Your appointment is at 2:15PM   Contact information:   [address]   [place] [address] [phone]       Signed: Sherrie GeorgeJENNINGS,Gable Odonohue 07/16/2014, 12:49 PM

## 2014-07-16 NOTE — Progress Notes (Signed)
1 Day Post-Op  Subjective: HE LOOKS fine, still pretty sore.  Sites are dry.  Objective: Vital signs in last 24 hours: Temp:  [97.7 F (36.5 C)-98.6 F (37 C)] 97.7 F (36.5 C) (01/12 0541) Pulse Rate:  [58-86] 63 (01/12 0541) Resp:  [12-19] 16 (01/12 0541) BP: (115-162)/(63-75) 125/67 mmHg (01/12 0541) SpO2:  [97 %-100 %] 98 % (01/12 0541) Last BM Date: 07/14/14  Intake/Output from previous day: 01/11 0701 - 01/12 0700 In: 3311.7 [I.V.:3061.7; IV Piggyback:250] Out: 675 [Urine:375] Intake/Output this shift: Total I/O In: -  Out: 325 [Urine:325]  General appearance: alert, cooperative and no distress GI: soft sore, sites are dry.  Lab Results:   Recent Labs  07/14/14 2310  WBC 14.6*  HGB 14.9  HCT 43.4  PLT 203    BMET  Recent Labs  07/14/14 2310  NA 135  K 3.9  CL 103  CO2 24  GLUCOSE 114*  BUN 27*  CREATININE 0.91  CALCIUM 9.3   PT/INR No results for input(s): LABPROT, INR in the last 72 hours.   Recent Labs Lab 07/14/14 2310  AST 21  ALT 30  ALKPHOS 95  BILITOT 0.8  PROT 8.1  ALBUMIN 4.8     Lipase     Component Value Date/Time   LIPASE 34 07/14/2014 2310     Studies/Results: [place] Abdomen Complete  07/14/2014   CLINICAL DATA:  Acute onset of upper quadrant abdominal pain and right flank pain. Back pain. Nausea, vomiting and chills. Initial encounter.  EXAM: ULTRASOUND ABDOMEN COMPLETE  COMPARISON:  CT of the abdomen and pelvis from 06/26/2014  FINDINGS: Gallbladder: Vague echogenic material within the gallbladder moves in a granular fashion, thought to reflect numerous tiny stones. A few larger stones are seen, measuring up to 1.0 cm in size. There is mild gallbladder wall thickening, measuring up to 4 mm, without definite pericholecystic fluid. No ultrasonographic Murphy's sign elicited, though the patient does have relatively significant focal tenderness.  Common bile duct: Diameter: 0.2 cm, within normal limits in caliber.  Liver: No  focal lesion identified. Within normal limits in parenchymal echogenicity.  IVC: No abnormality visualized.  Pancreas: Visualized portion unremarkable.  Spleen: Size and appearance within normal limits.  Right Kidney: Length: 12.6 cm. Echogenicity within normal limits. No mass or hydronephrosis visualized.  Left Kidney: Length: 13.3 cm. Echogenicity within normal limits. A small 1.4 cm cyst is noted at the interpole region of the left kidney, with minimal associated peripheral calcification. No evidence of hydronephrosis.  Abdominal aorta: No aneurysm visualized. The mid abdominal aorta is partially obscured by overlying bowel gas.  Other findings: None.  IMPRESSION: 1. Mild gallbladder wall thickening, with gravel and stones in the gallbladder. Significant focal tenderness at the gallbladder, despite the lack of an ultrasonographic Murphy's sign. This could reflect mild acute cholecystitis. No evidence for distal obstruction. 2. Small left renal cyst, with minimal peripheral calcification.   Electronically Signed   By: Roanna RaiderJeffery  Chang M.D.   On: 07/14/2014 23:01    Medications: . cefTRIAXone (ROCEPHIN)  IV  2 g Intravenous Q24H  . HYDROmorphone      . lip balm  1 application Topical BID  . orphenadrine  100 mg Oral BID  . saccharomyces boulardii  250 mg Oral BID   . dextrose 5 % and 0.9 % NaCl with KCl 20 mEq/L 50 mL/hr (07/16/14 0000)   Prior to Admission medications   Medication Sig Start Date End Date Taking? Authorizing Provider  oxyCODONE-acetaminophen (PERCOCET)  5-325 MG per tablet Take 1 tablet by mouth every 4 (four) hours as needed for moderate pain. 05/18/14  Yes Dione Booze, MD  meloxicam (MOBIC) 7.5 MG tablet Take 1 tablet (7.5 mg total) by mouth daily. Patient not taking: Reported on 07/15/2014 05/13/14   April K Palumbo-Rasch, MD  methocarbamol (ROBAXIN) 500 MG tablet Take 1 tablet (500 mg total) by mouth 2 (two) times daily. Patient not taking: Reported on 07/15/2014 05/13/14   April K  Palumbo-Rasch, MD  naproxen (NAPROSYN) 500 MG tablet Take 1 tablet (500 mg total) by mouth 2 (two) times daily. Patient not taking: Reported on 07/15/2014 05/18/14   Dione Booze, MD  orphenadrine (NORFLEX) 100 MG tablet Take 1 tablet (100 mg total) by mouth 2 (two) times daily. Patient not taking: Reported on 07/15/2014 05/18/14   Dione Booze, MD     Assessment/Plan Acute cholecystitis, cholelithiasis Umbilical hernia S/p LAPAROSCOPIC CHOLECYSTECTOMY SINGLE SITE  PRIMARY UMBILICAL HERNIA REPAIR, 07/15/14, Dr. Michaell Cowing Sleep apnea with CPAP Body mass index is 30.79    Plan:  Advance his diet, walk him around the halls and then home after lunch.     LOS: 2 days    John Rich 07/16/2014

## 2014-07-16 NOTE — Progress Notes (Signed)
RT setup Pt home CPAP with Pt home mask and tubing.  RT inspected machine and cords, no visual defects noted.  Pt machine is in proper working order.  Pt stated that he would self administer CPAP when ready for bed.  RT to monitor and assess as needed.

## 2016-03-03 IMAGING — CT CT RENAL STONE PROTOCOL
2 of 4 series · 17 of 46 positions shown, 19 images · non-contrast
Comparison: None.

CLINICAL DATA: Bilateral flank pain beginning 1 month ago.

EXAM:
CT ABDOMEN AND PELVIS WITHOUT CONTRAST
TECHNIQUE: Multidetector CT imaging of the abdomen and pelvis was performed
following the standard protocol without IV contrast.

[Series 2: renal stone < 200 lbs 5.0 b31f · axial · 0.87mm/px · z∈[-516,-56]mm · 14 of 102 slices shown, 16 images]
[im 5/102  soft-tissue]
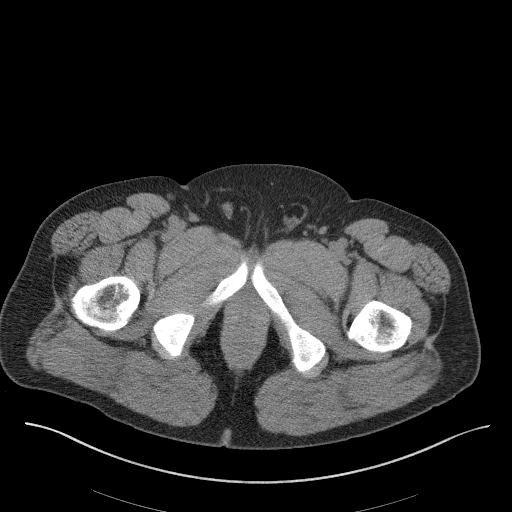
[im 5/102  bone]
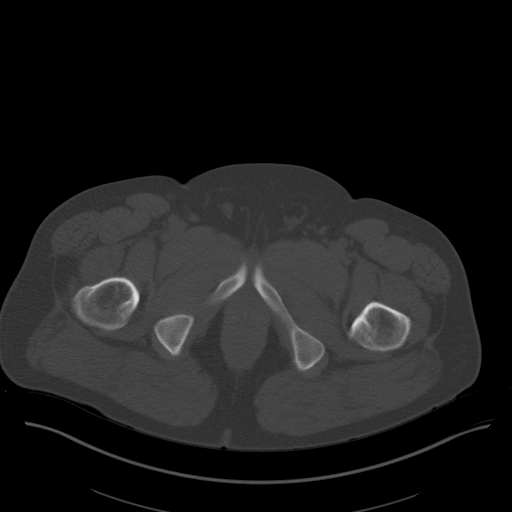
[im 13/102  soft-tissue]
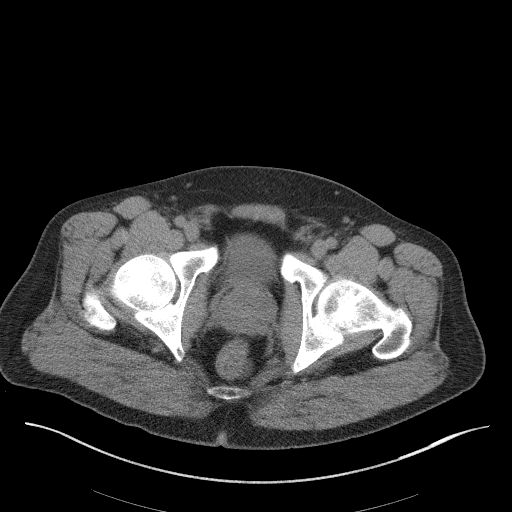
[im 21/102  soft-tissue]
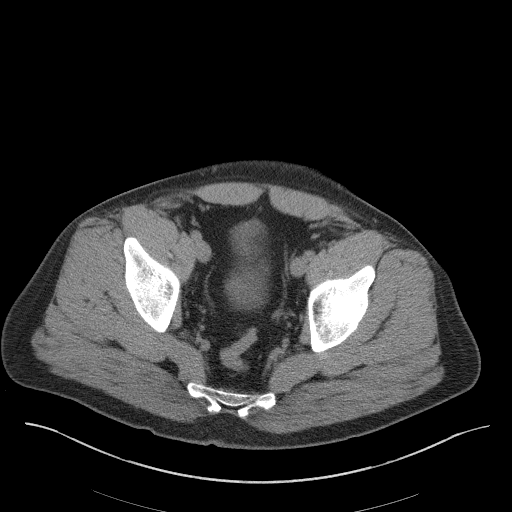
[im 29/102  soft-tissue]
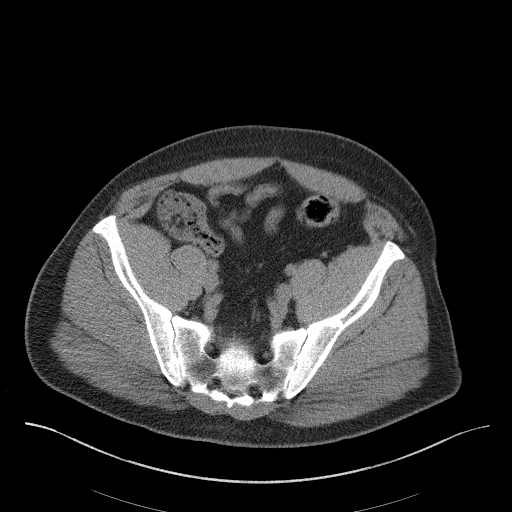
[im 33/102  soft-tissue]
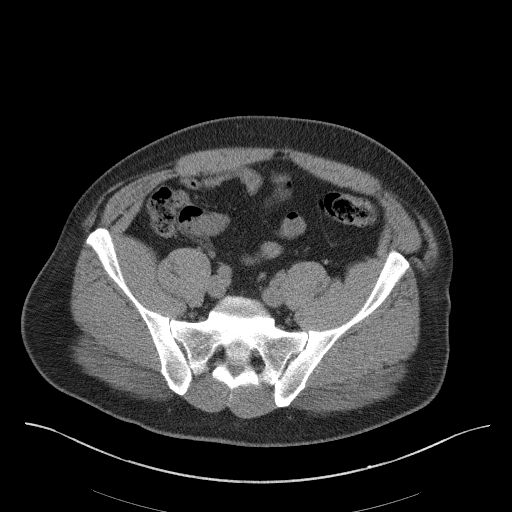
[im 41/102  soft-tissue]
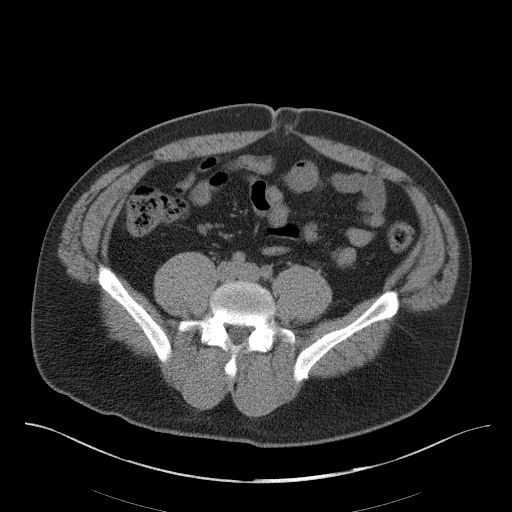
[im 49/102  soft-tissue]
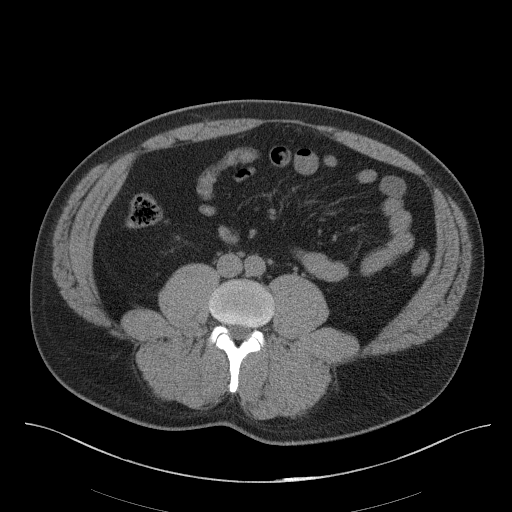
[im 53/102  soft-tissue]
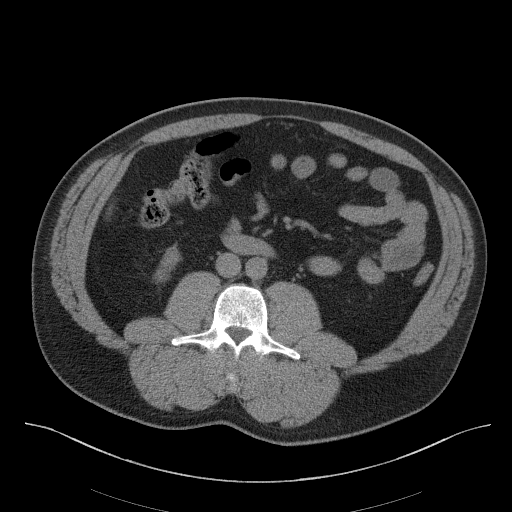
[im 61/102  soft-tissue]
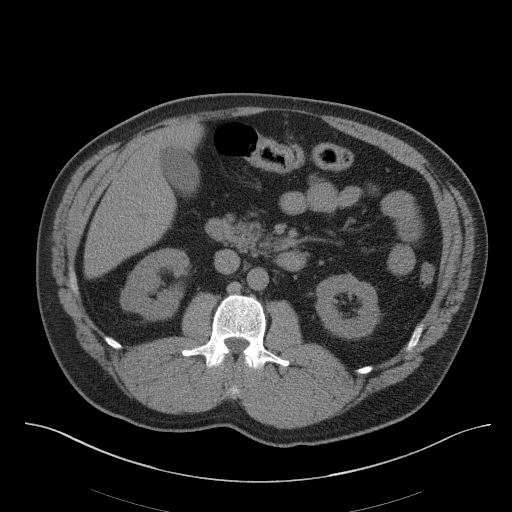
[im 61/102  bone]
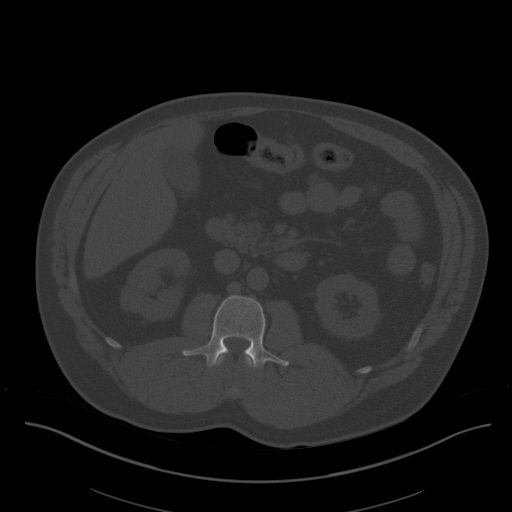
[im 69/102  soft-tissue]
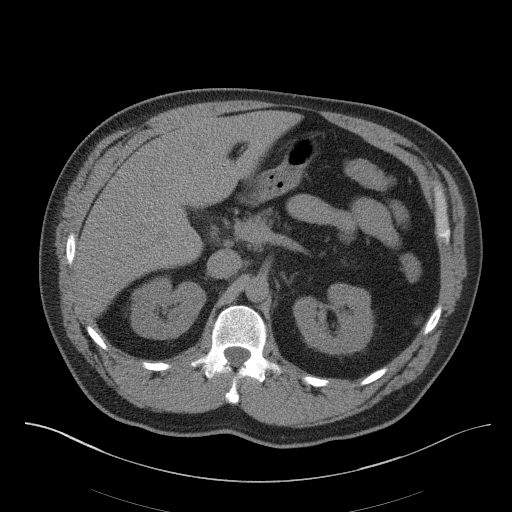
[im 77/102  soft-tissue]
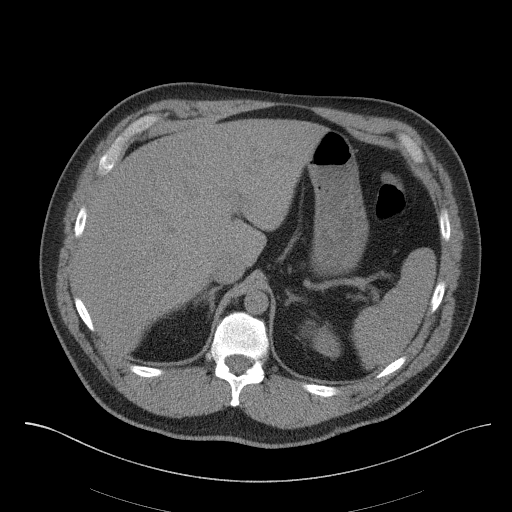
[im 81/102  soft-tissue]
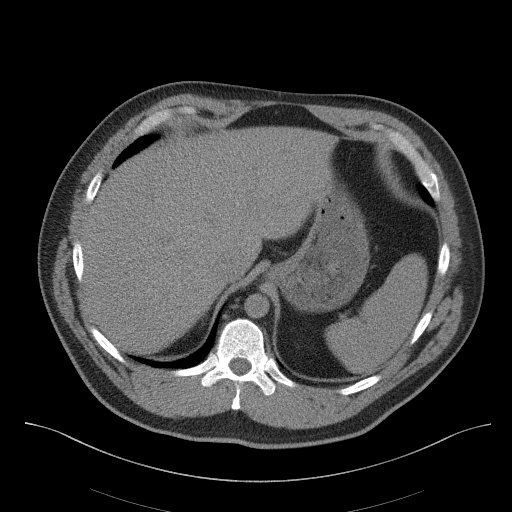
[im 89/102  soft-tissue]
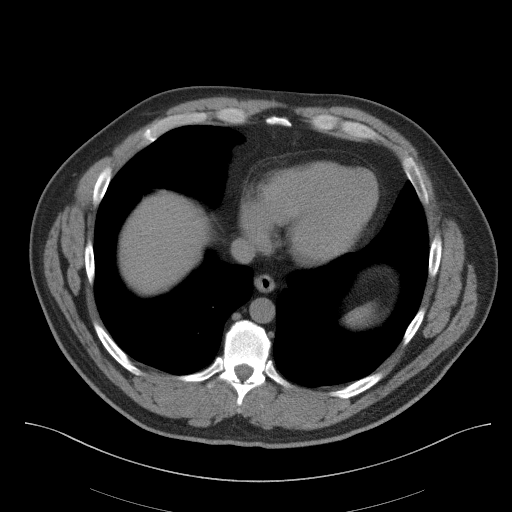
[im 97/102  soft-tissue]
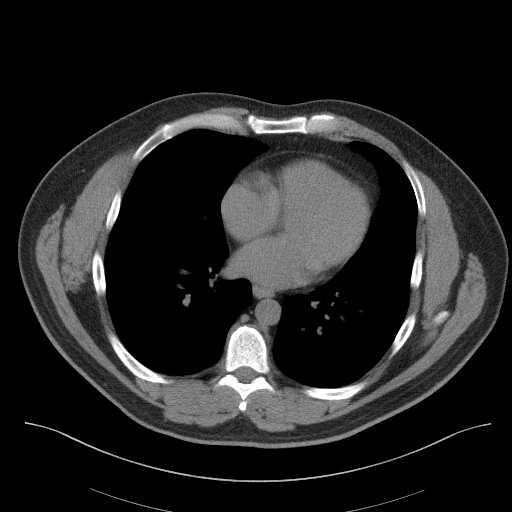

[Series 5: renal stone 3.0 coronal · coronal · 0.92mm/px · 3 of 101 slices shown]
[im 34/101  soft-tissue]
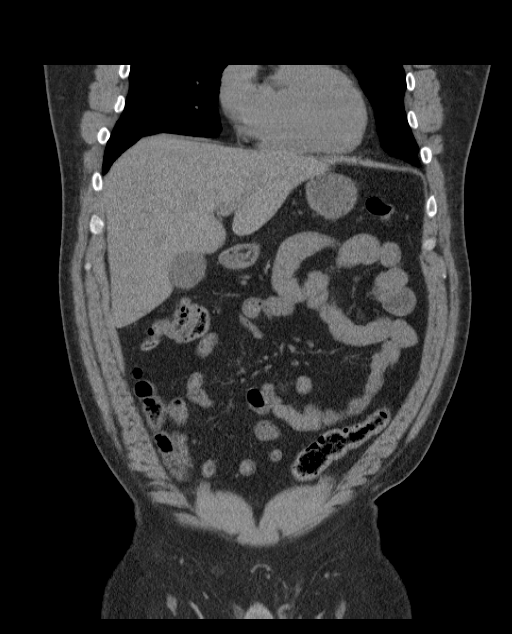
[im 45/101  soft-tissue]
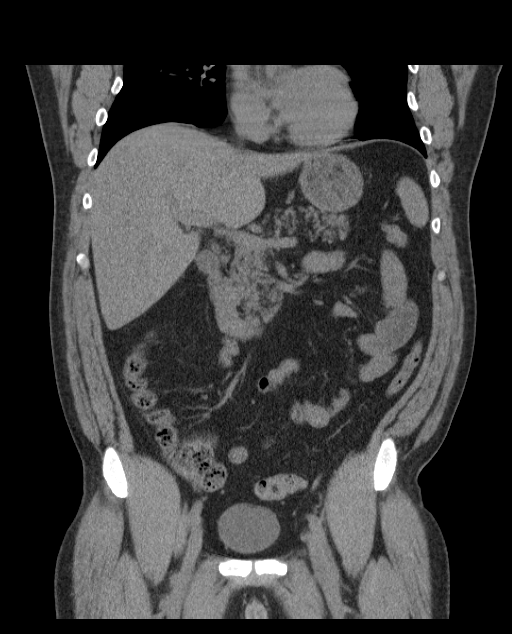
[im 56/101  soft-tissue]
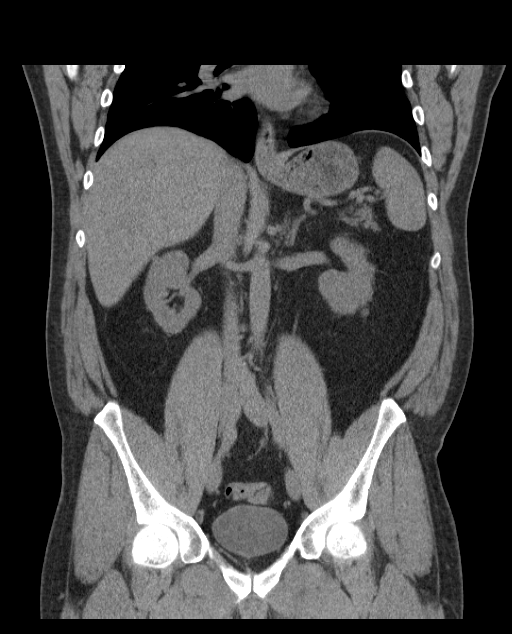

[17 of 46 positions shown; findings below may reference images not displayed]

FINDINGS: The lung bases are clear.  No pleural or pericardial effusion.

No renal or ureteral stones are identified. There is no
hydronephrosis on the right or left. The urinary bladder is
unremarkable. The prostate gland is mildly enlarged.

The liver, gallbladder, spleen, adrenal glands, biliary tree and
pancreas appear normal. The stomach, small and large bowel and
appendix appear normal. There is no lymphadenopathy or fluid. A very
small left periumbilical hernia is identified. No focal bony
abnormality is identified.
IMPRESSION: Negative for urinary tract stone.  No acute finding.

Mild enlargement of prostate gland.

Small fat containing periumbilical hernia.

## 2016-05-04 IMAGING — US US ABDOMEN COMPLETE
1 series · 13 of 25 positions shown · non-contrast
Comparison: CT of the abdomen and pelvis from 06/26/2014

CLINICAL DATA: Acute onset of upper quadrant abdominal pain and
right flank pain. Back pain. Nausea, vomiting and chills. Initial
encounter.

EXAM:
ULTRASOUND ABDOMEN COMPLETE

[Series 1: us abdomen complete · 0.31mm/px · 13 of 112 slices shown]
[im 1/112]
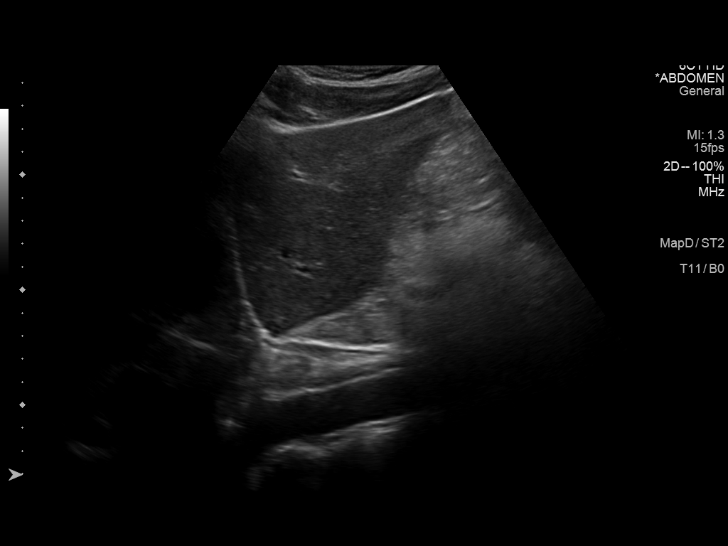
[im 10/112]
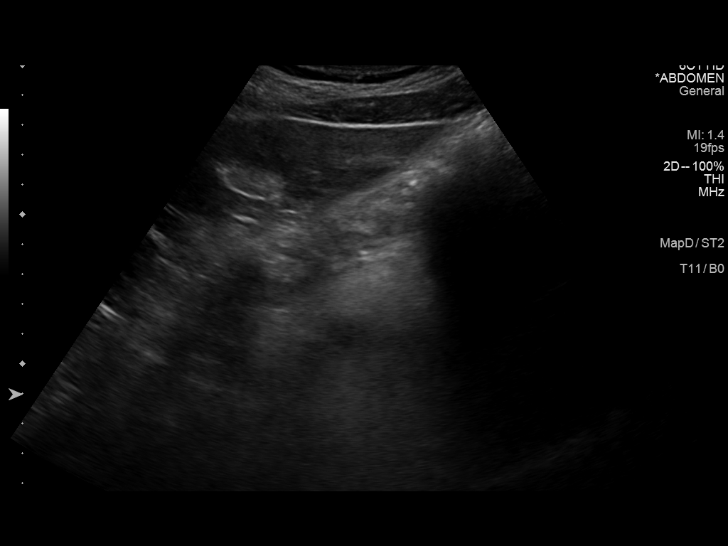
[im 19/112]
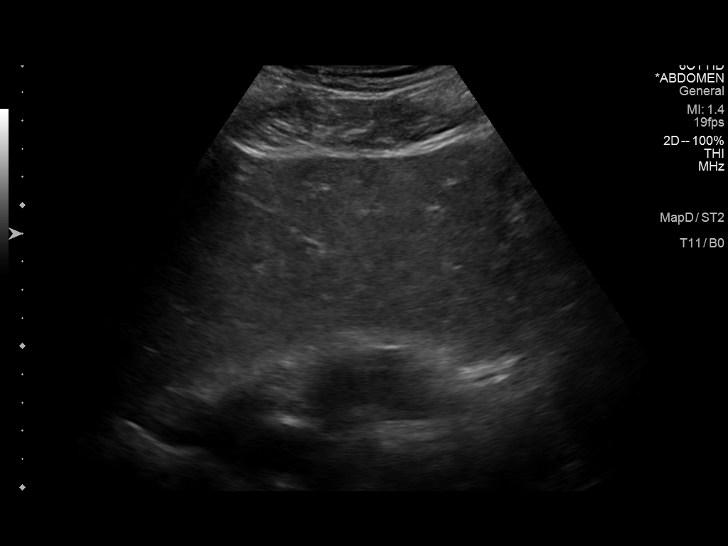
[im 28/112]
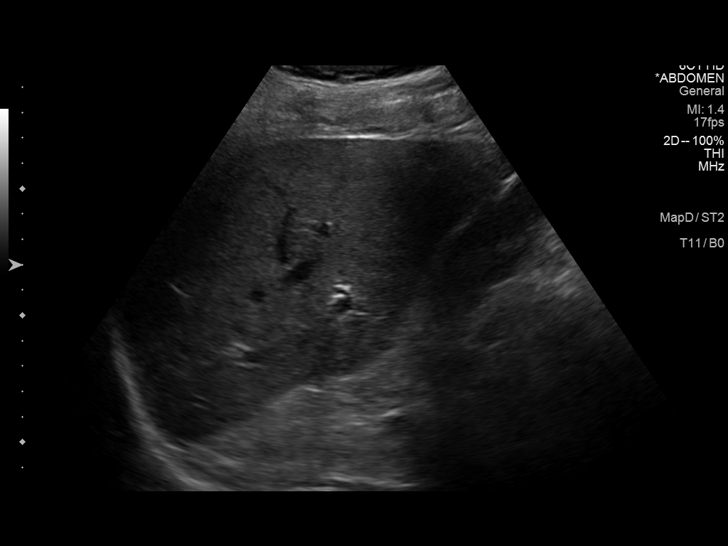
[im 38/112]
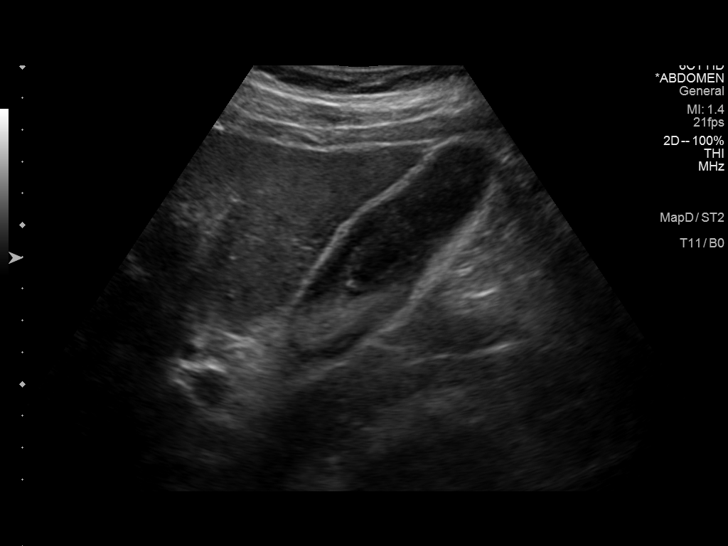
[im 47/112]
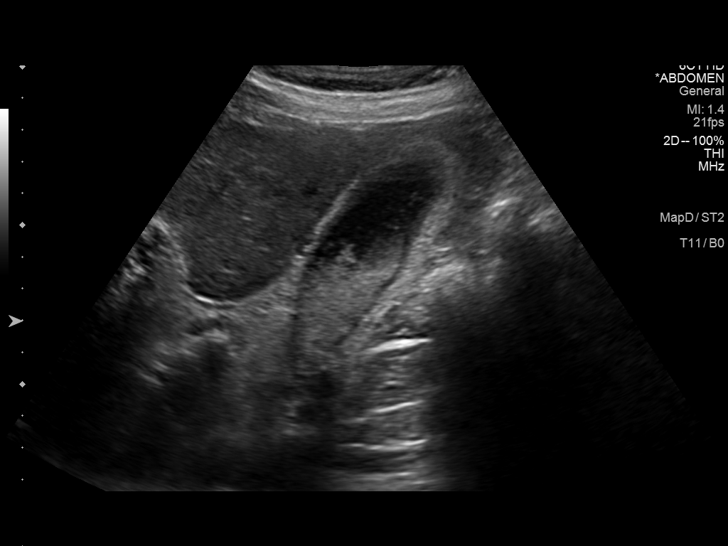
[im 56/112]
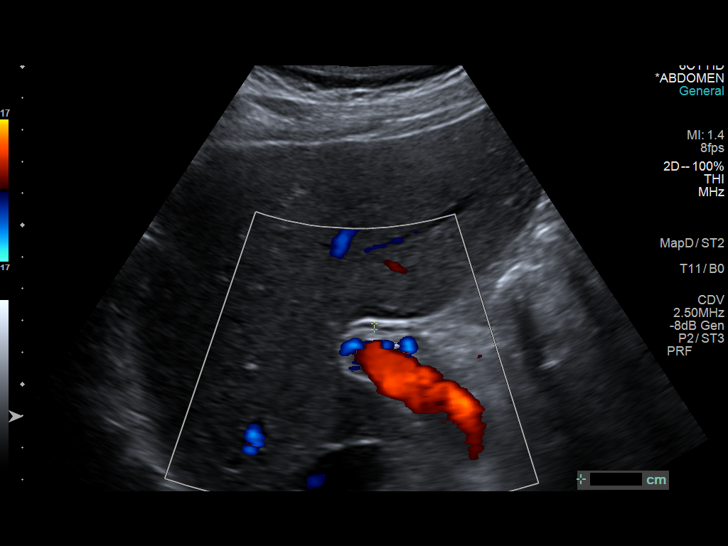
[im 65/112]
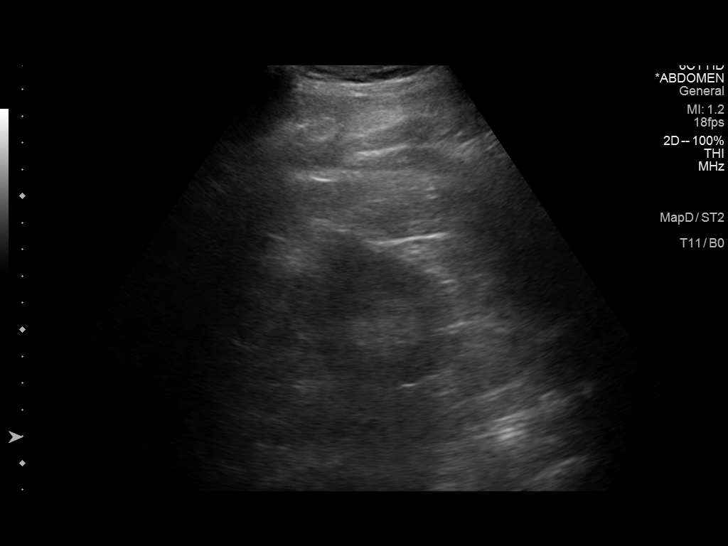
[im 75/112]
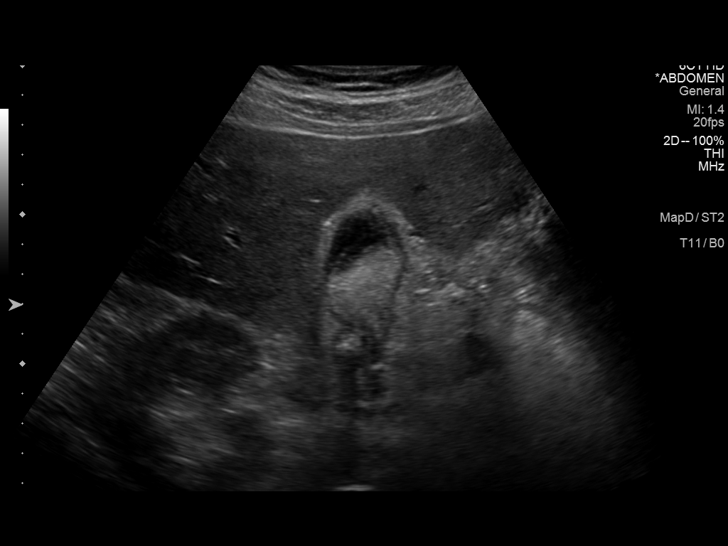
[im 84/112]
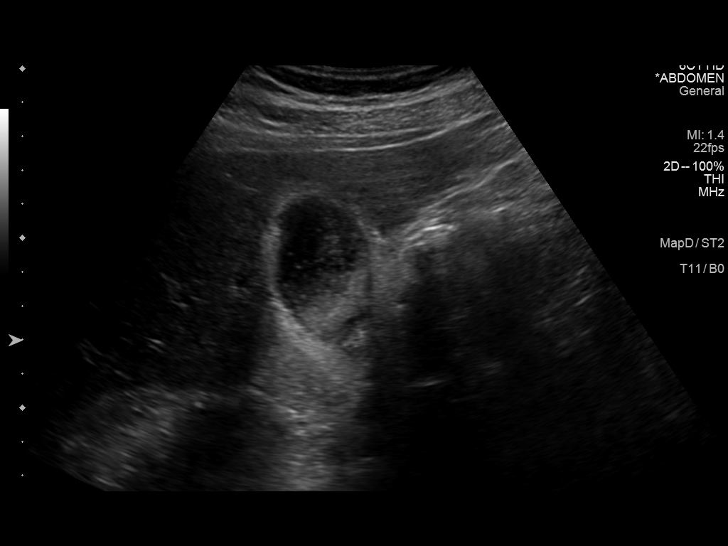
[im 93/112]
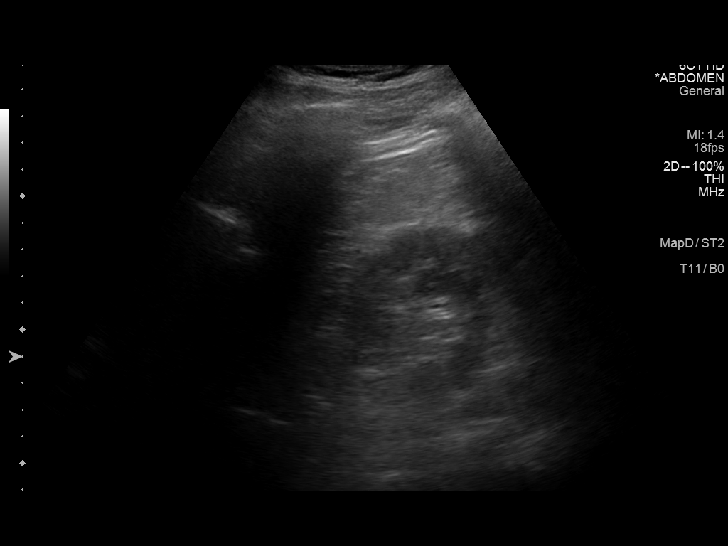
[im 102/112]
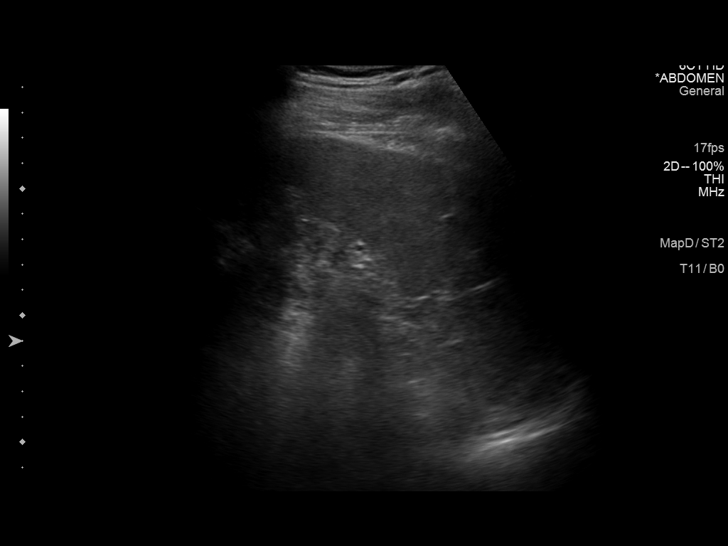
[im 112/112]
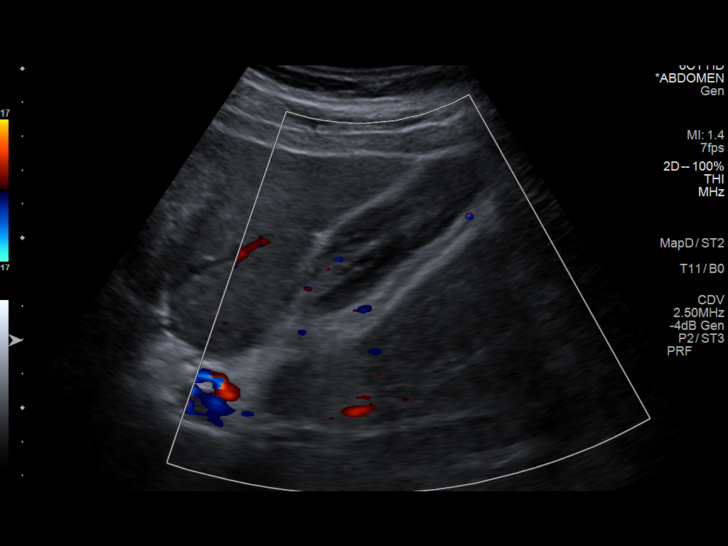

[13 of 25 positions shown; findings below may reference images not displayed]

FINDINGS: Gallbladder: Vague echogenic material within the gallbladder moves
in a granular fashion, thought to reflect numerous tiny stones. A
few larger stones are seen, measuring up to 1.0 cm in size. There is
mild gallbladder wall thickening, measuring up to 4 mm, without
definite pericholecystic fluid. No ultrasonographic Murphy's sign
elicited, though the patient does have relatively significant focal
tenderness.

Common bile duct: Diameter: 0.2 cm, within normal limits in caliber.

Liver: No focal lesion identified. Within normal limits in
parenchymal echogenicity.

IVC: No abnormality visualized.

Pancreas: Visualized portion unremarkable.

Spleen: Size and appearance within normal limits.

Right Kidney: Length: 12.6 cm. Echogenicity within normal limits. No
mass or hydronephrosis visualized.

Left Kidney: Length: 13.3 cm. Echogenicity within normal limits. A
small 1.4 cm cyst is noted at the interpole region of the left
kidney, with minimal associated peripheral calcification. No
evidence of hydronephrosis.

Abdominal aorta: No aneurysm visualized. The mid abdominal aorta is
partially obscured by overlying bowel gas.

Other findings: None.
IMPRESSION: 1. Mild gallbladder wall thickening, with gravel and stones in the
gallbladder. Significant focal tenderness at the gallbladder,
despite the lack of an ultrasonographic Murphy's sign. This could
reflect mild acute cholecystitis. No evidence for distal
obstruction.
2. Small left renal cyst, with minimal peripheral calcification.

## 2019-09-28 ENCOUNTER — Ambulatory Visit: Payer: 59 | Attending: Internal Medicine

## 2019-09-28 DIAGNOSIS — Z23 Encounter for immunization: Secondary | ICD-10-CM

## 2019-09-28 NOTE — Progress Notes (Signed)
   Covid-19 Vaccination Clinic  Name:  IAAN OREGEL    MRN: [id number] DOB: [birth year]  09/28/2019  Mr. Lichter was observed post Covid-19 immunization for 15 minutes without incident. He was provided with Vaccine Information Sheet and instruction to access the V-Safe system.   Mr. Cubit was instructed to call 911 with any severe reactions post vaccine: Marland Kitchen Difficulty breathing  . Swelling of face and throat  . A fast heartbeat  . A bad rash all over body  . Dizziness and weakness   Immunizations Administered    Name Date Dose VIS Date Route   Pfizer COVID-19 Vaccine 09/28/2019  4:48 PM 0.3 mL 06/15/2019 Intramuscular   Manufacturer: [employer], [employer]   Lot: [id number]   NDC: [address]-3

## 2019-10-23 ENCOUNTER — Ambulatory Visit: Payer: 59 | Attending: Internal Medicine

## 2019-10-23 DIAGNOSIS — Z23 Encounter for immunization: Secondary | ICD-10-CM

## 2019-10-23 NOTE — Progress Notes (Signed)
   Covid-19 Vaccination Clinic  Name:  John Rich    MRN: [id number] DOB: Apr 10, 1971  10/23/2019  Mr. Grenfell was observed post Covid-19 immunization for 15 minutes without incident. He was provided with Vaccine Information Sheet and instruction to access the V-Safe system.   Mr. Shi was instructed to call 911 with any severe reactions post vaccine: Marland Kitchen Difficulty breathing  . Swelling of face and throat  . A fast heartbeat  . A bad rash all over body  . Dizziness and weakness   Immunizations Administered    Name Date Dose VIS Date Route   Pfizer COVID-19 Vaccine 10/23/2019  3:56 PM 0.3 mL 08/29/2018 Intramuscular   Manufacturer: [employer], [employer]   Lot: [id number]   NDC: [address]-2
